# Patient Record
Sex: Female | Born: 1995 | Race: Black or African American | Hispanic: No | Marital: Single | Smoking: Former smoker
Health system: Southern US, Community
[De-identification: ages and names within clinical notes are randomized; demographics above are authoritative.]

## PROBLEM LIST (undated history)

## (undated) ENCOUNTER — Ambulatory Visit (HOSPITAL_COMMUNITY): Admission: EM | Payer: Medicaid Other | Source: Home / Self Care

## (undated) DIAGNOSIS — E669 Obesity, unspecified: Secondary | ICD-10-CM

## (undated) HISTORY — PX: OTHER SURGICAL HISTORY: SHX169

## (undated) HISTORY — PX: TONSILLECTOMY: SUR1361

## (undated) HISTORY — PX: TONSILECTOMY, ADENOIDECTOMY, BILATERAL MYRINGOTOMY AND TUBES: SHX2538

---
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
## 2017-05-08 ENCOUNTER — Ambulatory Visit: Payer: Self-pay | Admitting: Nurse Practitioner

## 2017-10-12 IMAGING — US US ABDOMEN COMPLETE
1 series · 14 of 25 positions shown · non-contrast
Comparison: None.

CLINICAL DATA: Abdominal pain for 2 weeks

EXAM:
ABDOMEN ULTRASOUND COMPLETE

[Series 1: us abdomen complete · 0.32mm/px · 14 of 75 slices shown]
[im 1/75]
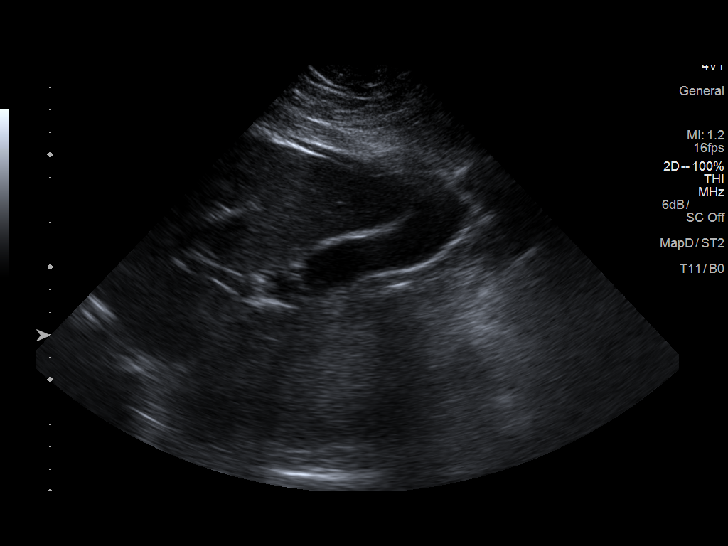
[im 7/75]
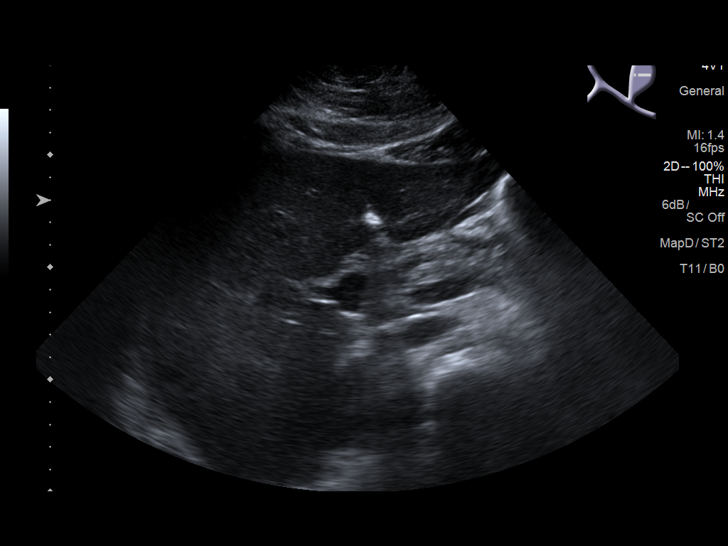
[im 13/75]
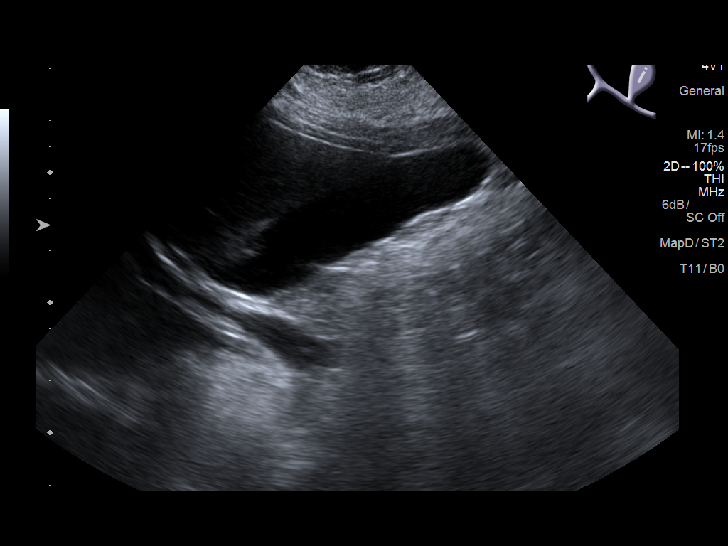
[im 19/75]
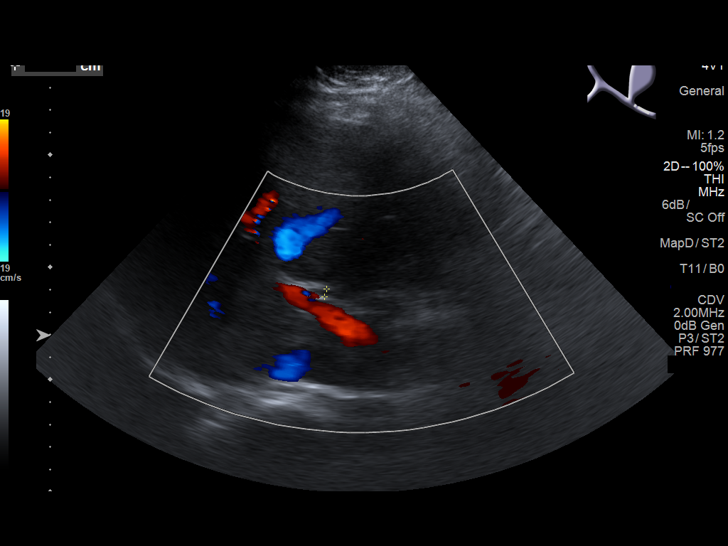
[im 25/75]
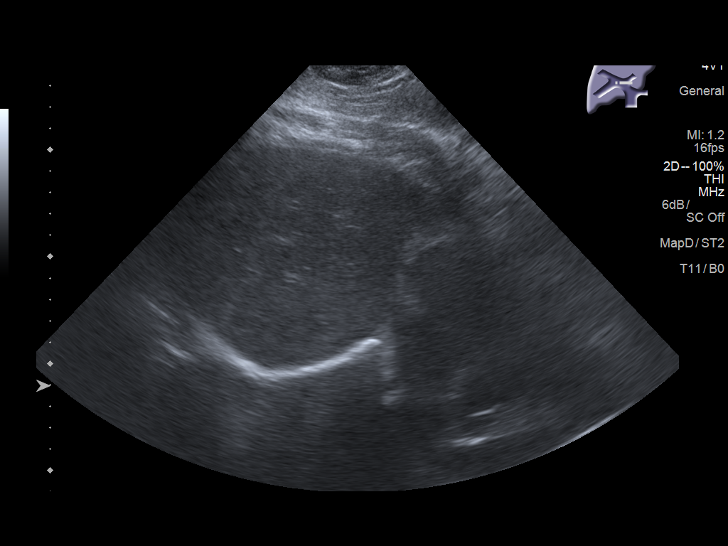
[im 28/75]
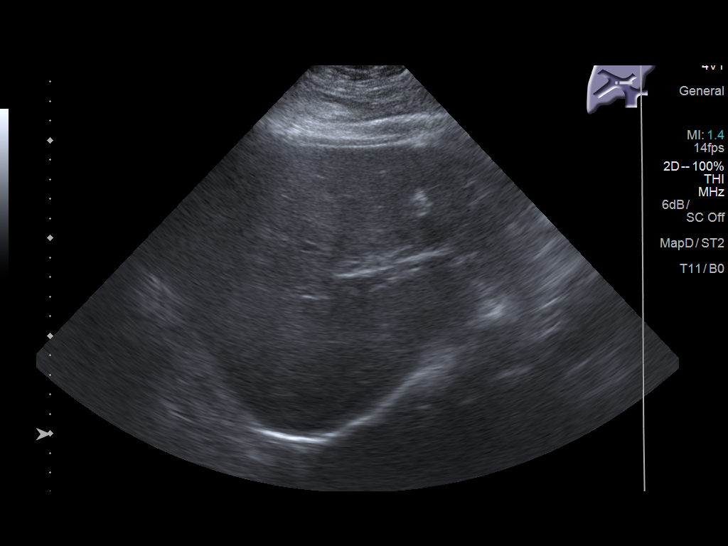
[im 34/75]
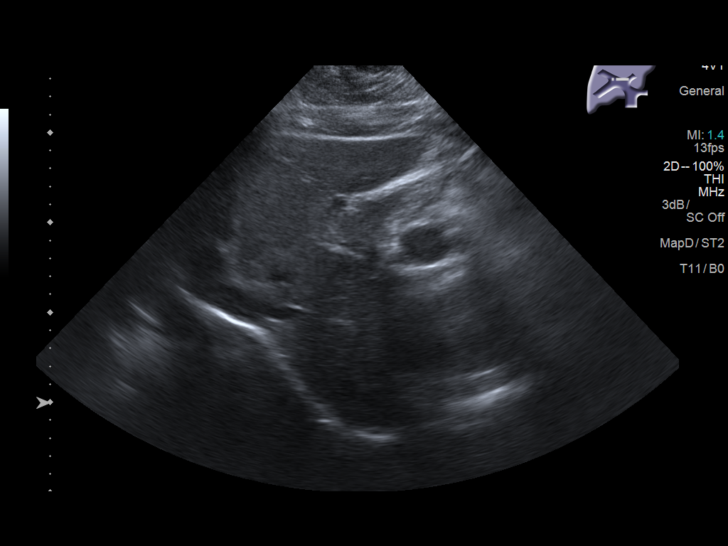
[im 41/75]
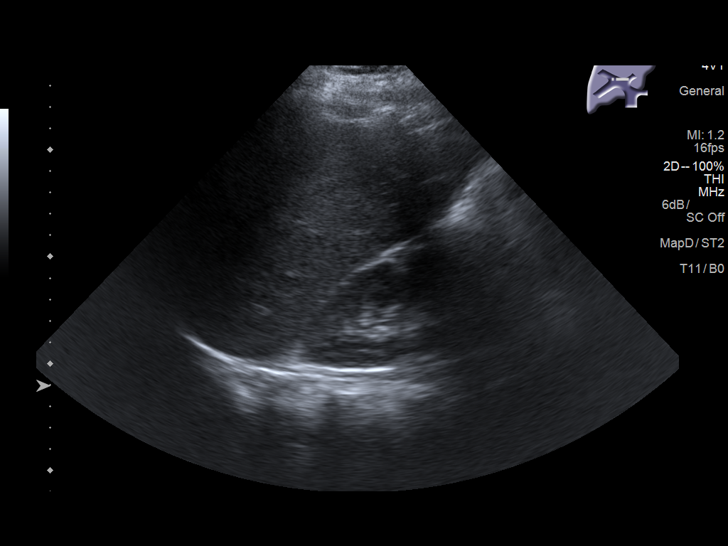
[im 47/75]
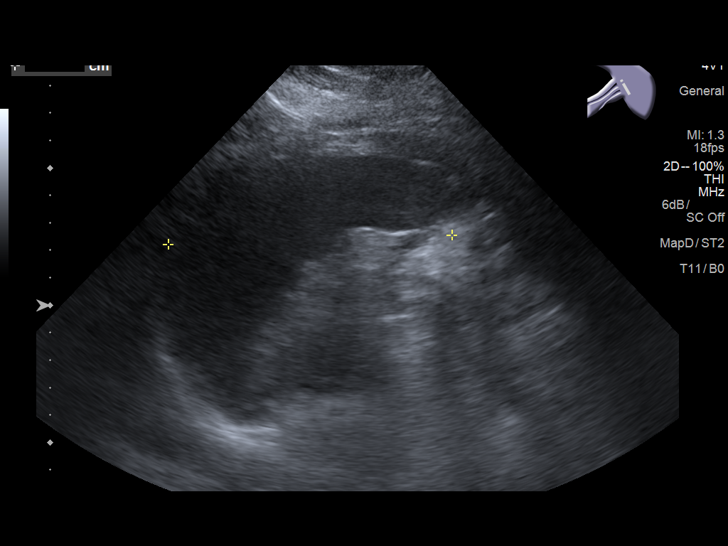
[im 50/75]
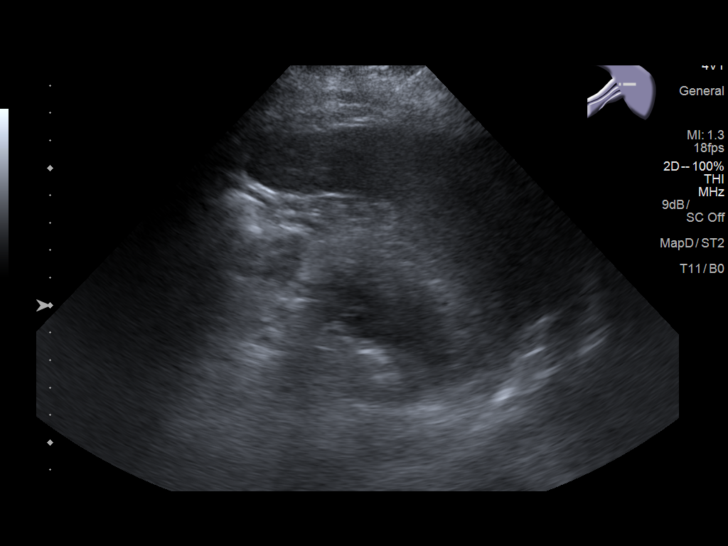
[im 56/75]
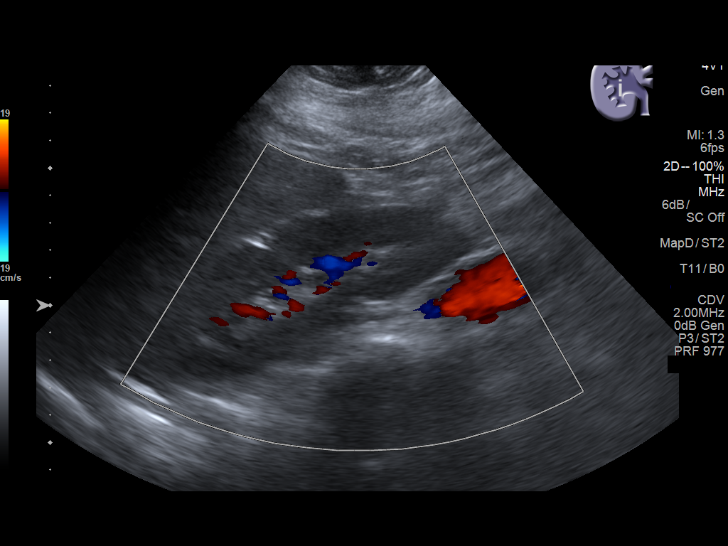
[im 62/75]
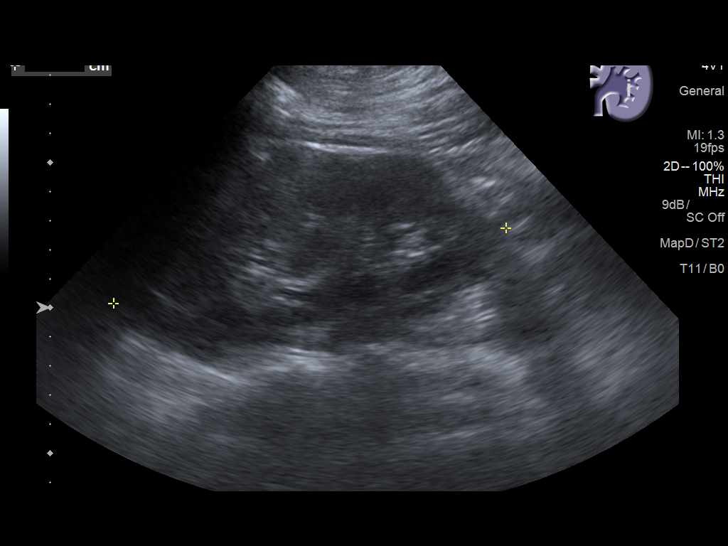
[im 68/75]
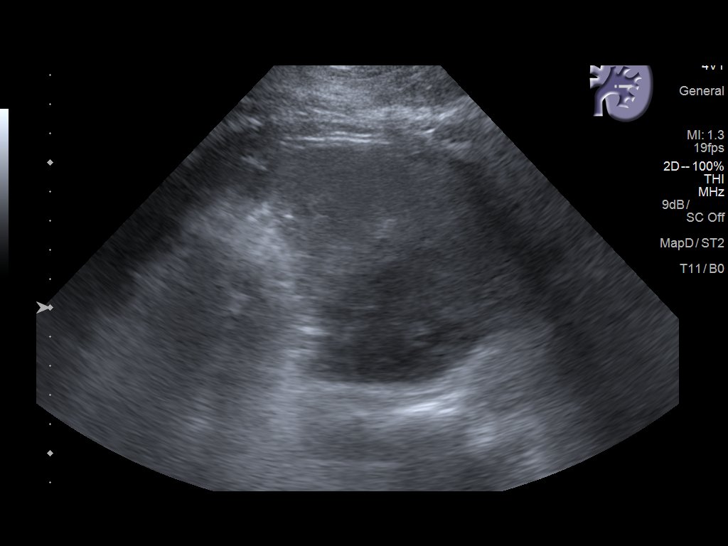
[im 75/75]
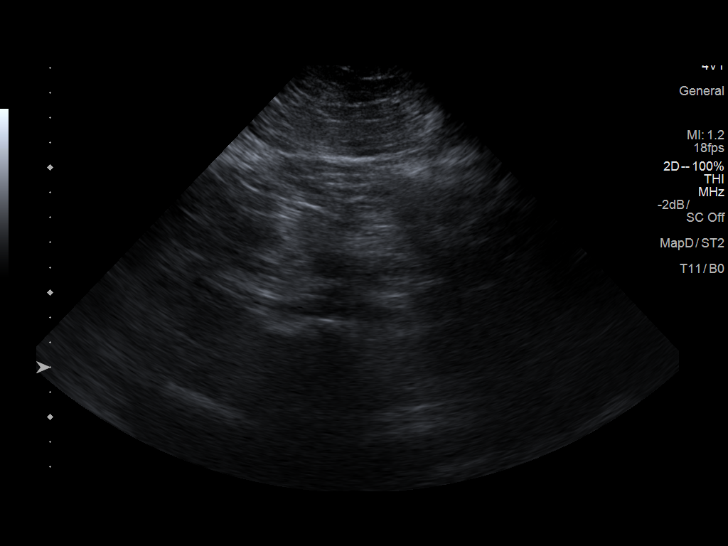

[14 of 25 positions shown; findings below may reference images not displayed]

FINDINGS: Gallbladder: No gallstones or wall thickening visualized. No
sonographic Murphy sign noted by sonographer.

Common bile duct: Diameter: 3.6 mm

Liver: Increased hepatic echogenicity consistent with fatty
infiltration. No focal hepatic abnormality is visualized.

IVC: No abnormality visualized.

Pancreas: Visualized portion unremarkable.

Spleen: Size and appearance within normal limits.

Right Kidney: Length: 11.9 cm. Echogenicity within normal limits. No
mass or hydronephrosis visualized.

Left Kidney: Length: 13.7 cm. Echogenicity within normal limits. No
mass or hydronephrosis visualized.

Abdominal aorta: No aneurysm visualized.

Other findings: None.
IMPRESSION: Mild fatty infiltration of the liver. Otherwise negative abdominal
ultrasound

## 2018-02-28 ENCOUNTER — Emergency Department (HOSPITAL_COMMUNITY): Payer: Self-pay

## 2018-02-28 ENCOUNTER — Emergency Department (HOSPITAL_COMMUNITY)
Admission: EM | Admit: 2018-02-28 | Discharge: 2018-03-01 | Disposition: A | Discharge status: Home / Self Care | Payer: Self-pay | Attending: Emergency Medicine | Admitting: Emergency Medicine

## 2018-02-28 ENCOUNTER — Encounter (HOSPITAL_COMMUNITY): Payer: Self-pay

## 2018-02-28 DIAGNOSIS — R600 Localized edema: Secondary | ICD-10-CM | POA: Insufficient documentation

## 2018-02-28 DIAGNOSIS — M25462 Effusion, left knee: Secondary | ICD-10-CM

## 2018-02-28 DIAGNOSIS — R451 Restlessness and agitation: Secondary | ICD-10-CM | POA: Insufficient documentation

## 2018-02-28 DIAGNOSIS — F17200 Nicotine dependence, unspecified, uncomplicated: Secondary | ICD-10-CM | POA: Insufficient documentation

## 2018-02-28 DIAGNOSIS — S022XXA Fracture of nasal bones, initial encounter for closed fracture: Secondary | ICD-10-CM

## 2018-02-28 DIAGNOSIS — M25562 Pain in left knee: Secondary | ICD-10-CM | POA: Insufficient documentation

## 2018-02-28 DIAGNOSIS — S060X1A Concussion with loss of consciousness of 30 minutes or less, initial encounter: Secondary | ICD-10-CM

## 2018-02-28 DIAGNOSIS — M542 Cervicalgia: Secondary | ICD-10-CM | POA: Insufficient documentation

## 2018-02-28 DIAGNOSIS — Y92810 Car as the place of occurrence of the external cause: Secondary | ICD-10-CM | POA: Insufficient documentation

## 2018-02-28 DIAGNOSIS — J45909 Unspecified asthma, uncomplicated: Secondary | ICD-10-CM | POA: Insufficient documentation

## 2018-02-28 DIAGNOSIS — Y999 Unspecified external cause status: Secondary | ICD-10-CM | POA: Insufficient documentation

## 2018-02-28 DIAGNOSIS — Z23 Encounter for immunization: Secondary | ICD-10-CM | POA: Insufficient documentation

## 2018-02-28 DIAGNOSIS — Y9389 Activity, other specified: Secondary | ICD-10-CM | POA: Insufficient documentation

## 2018-02-28 LAB — RAPID URINE DRUG SCREEN, HOSP PERFORMED
Amphetamines: NOT DETECTED
BARBITURATES: NOT DETECTED
BENZODIAZEPINES: NOT DETECTED
COCAINE: NOT DETECTED
Opiates: NOT DETECTED
TETRAHYDROCANNABINOL: POSITIVE — AB

## 2018-02-28 LAB — COMPREHENSIVE METABOLIC PANEL
ALT: 10 U/L (ref 0–44)
ANION GAP: 8 (ref 5–15)
AST: 16 U/L (ref 15–41)
Albumin: 3.5 g/dL (ref 3.5–5.0)
Alkaline Phosphatase: 51 U/L (ref 38–126)
BILIRUBIN TOTAL: 0.4 mg/dL (ref 0.3–1.2)
BUN: 7 mg/dL (ref 6–20)
CALCIUM: 8.8 mg/dL — AB (ref 8.9–10.3)
CO2: 26 mmol/L (ref 22–32)
Chloride: 103 mmol/L (ref 98–111)
Creatinine, Ser: 0.75 mg/dL (ref 0.44–1.00)
Glucose, Bld: 91 mg/dL (ref 70–99)
Potassium: 3.6 mmol/L (ref 3.5–5.1)
SODIUM: 137 mmol/L (ref 135–145)
TOTAL PROTEIN: 6.7 g/dL (ref 6.5–8.1)

## 2018-02-28 LAB — I-STAT CHEM 8, ED
BUN: 7 mg/dL (ref 6–20)
CALCIUM ION: 1.12 mmol/L — AB (ref 1.15–1.40)
CHLORIDE: 101 mmol/L (ref 98–111)
CREATININE: 0.7 mg/dL (ref 0.44–1.00)
GLUCOSE: 91 mg/dL (ref 70–99)
HCT: 38 % (ref 36.0–46.0)
Hemoglobin: 12.9 g/dL (ref 12.0–15.0)
Potassium: 3.6 mmol/L (ref 3.5–5.1)
Sodium: 140 mmol/L (ref 135–145)
TCO2: 27 mmol/L (ref 22–32)

## 2018-02-28 LAB — URINALYSIS, ROUTINE W REFLEX MICROSCOPIC
Bilirubin Urine: NEGATIVE
GLUCOSE, UA: NEGATIVE mg/dL
Ketones, ur: NEGATIVE mg/dL
LEUKOCYTES UA: NEGATIVE
NITRITE: NEGATIVE
PROTEIN: NEGATIVE mg/dL
SPECIFIC GRAVITY, URINE: 1.019 (ref 1.005–1.030)
pH: 5 (ref 5.0–8.0)

## 2018-02-28 LAB — I-STAT CG4 LACTIC ACID, ED: Lactic Acid, Venous: 0.85 mmol/L (ref 0.5–1.9)

## 2018-02-28 LAB — I-STAT BETA HCG BLOOD, ED (MC, WL, AP ONLY): I-stat hCG, quantitative: <5 m[IU]/mL (ref <5)

## 2018-02-28 LAB — CBC
HCT: 40.4 % (ref 36.0–46.0)
Hemoglobin: 12.4 g/dL (ref 12.0–15.0)
MCH: 25.8 pg — ABNORMAL LOW (ref 26.0–34.0)
MCHC: 30.7 g/dL (ref 30.0–36.0)
MCV: 84 fL (ref 80.0–100.0)
NRBC: 0 % (ref 0.0–0.2)
PLATELETS: 309 10*3/uL (ref 150–400)
RBC: 4.81 MIL/uL (ref 3.87–5.11)
RDW: 13.4 % (ref 11.5–15.5)
WBC: 14.3 10*3/uL — AB (ref 4.0–10.5)

## 2018-02-28 LAB — CDS SEROLOGY

## 2018-02-28 LAB — PROTIME-INR
INR: 1.02
Prothrombin Time: 13.3 seconds (ref 11.4–15.2)

## 2018-02-28 LAB — SAMPLE TO BLOOD BANK

## 2018-02-28 LAB — ETHANOL

## 2018-02-28 MED ORDER — TETANUS-DIPHTH-ACELL PERTUSSIS 5-2.5-18.5 LF-MCG/0.5 IM SUSP
0.5000 mL | Freq: Once | INTRAMUSCULAR | Status: AC
  Administered 2018-02-28: 0.5 mL via INTRAMUSCULAR
  Filled 2018-02-28: qty 0.5

## 2018-02-28 MED ORDER — LIDOCAINE HCL (PF) 1 % IJ SOLN
5.0000 mL | Freq: Once | INTRAMUSCULAR | Status: AC
  Administered 2018-02-28: 5 mL via INTRADERMAL
  Filled 2018-02-28: qty 5

## 2018-02-28 MED ORDER — FENTANYL CITRATE (PF) 100 MCG/2ML IJ SOLN
50.0000 ug | Freq: Once | INTRAMUSCULAR | Status: AC
  Administered 2018-02-28: 50 ug via INTRAVENOUS
  Filled 2018-02-28: qty 2

## 2018-02-28 NOTE — ED Notes (Signed)
Pt refuses to get on monitor
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
## 2018-03-01 ENCOUNTER — Encounter (HOSPITAL_COMMUNITY): Payer: Self-pay | Admitting: Emergency Medicine

## 2018-03-01 ENCOUNTER — Emergency Department (HOSPITAL_COMMUNITY): Payer: Self-pay

## 2018-03-01 MED ORDER — CEFAZOLIN SODIUM-DEXTROSE 1-4 GM/50ML-% IV SOLN
1.0000 g | Freq: Once | INTRAVENOUS | Status: AC
  Administered 2018-03-01: 1 g via INTRAVENOUS
  Filled 2018-03-01: qty 50

## 2018-03-01 MED ORDER — CEPHALEXIN 500 MG PO CAPS: 500.0000 mg | ORAL_CAPSULE | Freq: Two times a day (BID) | ORAL | 0 refills | Status: AC

## 2018-03-01 MED ORDER — HYDROCODONE-ACETAMINOPHEN 5-325 MG PO TABS: 1.0000 | ORAL_TABLET | Freq: Four times a day (QID) | ORAL | 0 refills | Status: DC | PRN
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
## 2018-03-12 ENCOUNTER — Inpatient Hospital Stay: Payer: Self-pay

## 2018-04-09 ENCOUNTER — Ambulatory Visit (HOSPITAL_COMMUNITY)
Admission: EM | Admit: 2018-04-09 | Discharge: 2018-04-09 | Disposition: A | Discharge status: Home / Self Care | Payer: Medicaid Other | Attending: Internal Medicine | Admitting: Internal Medicine

## 2018-04-09 ENCOUNTER — Encounter (HOSPITAL_COMMUNITY): Payer: Self-pay | Admitting: Emergency Medicine

## 2018-04-09 DIAGNOSIS — R030 Elevated blood-pressure reading, without diagnosis of hypertension: Secondary | ICD-10-CM | POA: Insufficient documentation

## 2018-04-09 DIAGNOSIS — J069 Acute upper respiratory infection, unspecified: Secondary | ICD-10-CM

## 2018-04-09 MED ORDER — OXYMETAZOLINE HCL 0.05 % NA SOLN: 2.0000 | Freq: Two times a day (BID) | NASAL | 0 refills | Status: AC

## 2018-04-09 MED ORDER — GUAIFENESIN-CODEINE 100-10 MG/5ML PO SOLN: 10.0000 mL | Freq: Four times a day (QID) | ORAL | 0 refills | Status: AC | PRN

## 2018-04-09 NOTE — ED Triage Notes (Signed)
Pt c/o URI symptoms, coughing, sneezing, congestion, headaches x2 days.

## 2018-04-09 NOTE — ED Provider Notes (Signed)
[facility]    CSN: [id number] Arrival date & time: 04/09/18  1749     History   Chief Complaint Chief Complaint  Patient presents with  . URI    HPI Briana Lara is a 22 y.o. female.   Who presents with with rhinitis, cough, stuffy nose and fever up to 99.9. Denies body aches, but has had HA on her forehead. Does not have a PCP. States she broke her nose in November and feels now her nose is burning from the congestion. She missed work today since she was not feeling well.     Past Medical History:  Diagnosis Date  . Asthma   . Obesity     There are no active problems to display for this patient.   Past Surgical History:  Procedure Laterality Date  . nasal abscess    . TONSILECTOMY, ADENOIDECTOMY, BILATERAL MYRINGOTOMY AND TUBES    . TONSILLECTOMY      OB History   No obstetric history on file.    Home Medications    Prior to Admission medications   Medication Sig Start Date End Date Taking? Authorizing Provider  ibuprofen (ADVIL,MOTRIN) 600 MG tablet Take 1 tablet (600 mg total) by mouth every 6 (six) hours as needed. 03/15/16   Kirichenko, Lemont Fillers, PA-C  sodium-potassium bicarbonate (ALKA-SELTZER GOLD) TBEF dissolvable tablet Take 1 tablet by mouth daily as needed.    [provider]    Family History No family history on file.  Social History Social History   Tobacco Use  . Smoking status: Current Every Day Smoker  Substance Use Topics  . Alcohol use: No  . Drug use: Yes    Types: Marijuana     Allergies   Mango flavor   Review of Systems Review of Systems  Constitutional: Positive for chills, fatigue and fever. Negative for diaphoresis.  HENT: Positive for congestion, postnasal drip and rhinorrhea. Negative for ear discharge, ear pain, facial swelling, mouth sores, sore throat, trouble swallowing and voice change.   Eyes: Negative for discharge.  Respiratory: Positive for cough. Negative for shortness of breath  and wheezing.   Cardiovascular: Negative for chest pain.  Skin: Negative for rash.  Neurological: Positive for light-headedness.       Off and on since sick  Hematological: Negative for adenopathy.     Physical Exam Triage Vital Signs ED Triage Vitals [04/09/18 1803]  Enc Vitals Group     BP (!) 153/96     Pulse Rate 77     Resp 16     Temp 99.2 F (37.3 C)     Temp src      SpO2 100 %     Weight      Height      Head Circumference      Peak Flow      Pain Score 8     Pain Loc      Pain Edu?      Excl. in GC?    No data found.  Updated Vital Signs BP (!) 153/96   Pulse 77   Temp 99.2 F (37.3 C)   Resp 16   LMP 04/02/2018   SpO2 100%   Visual Acuity Right Eye Distance:   Left Eye Distance:   Bilateral Distance:    Right Eye Near:   Left Eye Near:    Bilateral Near:     Physical Exam Vitals signs and nursing note reviewed.  Constitutional:  General: She is not in acute distress.    Appearance: She is obese. She is not toxic-appearing.     Comments: Was on her phone texting almost the entire visit, til I asked her to put it away.   HENT:     Head: Normocephalic.     Right Ear: Tympanic membrane, ear canal and external ear normal.     Left Ear: Tympanic membrane, ear canal and external ear normal.     Nose: Congestion and rhinorrhea present.     Comments: Mild septal deviation on the R noted. Has moderate congestion on the L than R, but still able to pass air. No blood noted. Has clear nose mucous.     Mouth/Throat:     Mouth: Mucous membranes are moist.     Pharynx: No oropharyngeal exudate or posterior oropharyngeal erythema.  Eyes:     General: No scleral icterus.       Right eye: No discharge.        Left eye: No discharge.     Extraocular Movements: Extraocular movements intact.     Conjunctiva/sclera: Conjunctivae normal.  Neck:     Musculoskeletal: Neck supple. No neck rigidity.  Cardiovascular:     Rate and Rhythm: Normal rate and  regular rhythm.     Heart sounds: No murmur.  Pulmonary:     Effort: Pulmonary effort is normal. No respiratory distress.     Breath sounds: No wheezing or rales.  Musculoskeletal: Normal range of motion.  Lymphadenopathy:     Cervical: No cervical adenopathy.  Skin:    General: Skin is warm and dry.  Neurological:     General: No focal deficit present.     Mental Status: She is alert and oriented to person, place, and time.  Psychiatric:        Mood and Affect: Mood normal.        Behavior: Behavior normal.        Thought Content: Thought content normal.        Judgment: Judgment normal.    UC Treatments / Results  Labs (all labs ordered are listed, but only abnormal results are displayed) Labs Reviewed - No data to display  EKG None  Radiology No results found.  Procedures Procedures   Medications Ordered in UC Medications - No data to display  Initial Impression / Assessment and Plan / UC Course  I have reviewed the triage vital signs and the nursing notes. I reassured her she just has a cold, no signs of infection., May use Robitussin and Afrin prn.  If she develops a fever 100.5 or more, then needs to be seen.  Advised to do BP diaries and get established with a PCP.    Final Clinical Impressions(s) / UC Diagnoses   Final diagnoses:  None   Discharge Instructions   None    ED Prescriptions    None     Controlled Substance Prescriptions [place] Controlled Substance Registry consulted?    Garey HamRodriguez-Southworth, Ascher Schroepfer, [place] JerseyPA-C 04/09/18 2024

## 2018-04-09 NOTE — Discharge Instructions (Addendum)
You can try to do saline nose rinses twice a day to help with the nose congestion.   You have a virus which your body will fight on its own and may take 7-14 days. If you get worse with fevers over 100.5 or more, then you need to be examined again.  You may use cough medication as needed, and nose spray which was sent to your pharmacy to help the stuffy nose.   Monitor your blood pressure at the pharmacy and write them down. Needs to stay less than 140/90. If stays elevated, then you need to make sure you get established with a primary care provider.

## 2019-05-07 ENCOUNTER — Other Ambulatory Visit: Payer: Self-pay

## 2019-05-07 ENCOUNTER — Emergency Department (HOSPITAL_COMMUNITY): Payer: Self-pay

## 2019-05-07 ENCOUNTER — Emergency Department (HOSPITAL_COMMUNITY)
Admission: EM | Admit: 2019-05-07 | Discharge: 2019-05-07 | Disposition: A | Discharge status: Home / Self Care | Payer: Self-pay | Attending: Emergency Medicine | Admitting: Emergency Medicine

## 2019-05-07 ENCOUNTER — Encounter (HOSPITAL_COMMUNITY): Payer: Self-pay | Admitting: Emergency Medicine

## 2019-05-07 DIAGNOSIS — K047 Periapical abscess without sinus: Secondary | ICD-10-CM | POA: Insufficient documentation

## 2019-05-07 DIAGNOSIS — J45909 Unspecified asthma, uncomplicated: Secondary | ICD-10-CM | POA: Insufficient documentation

## 2019-05-07 DIAGNOSIS — F17200 Nicotine dependence, unspecified, uncomplicated: Secondary | ICD-10-CM | POA: Insufficient documentation

## 2019-05-07 LAB — CBC WITH DIFFERENTIAL/PLATELET
Abs Immature Granulocytes: 0.04 10*3/uL (ref 0.00–0.07)
Basophils Absolute: 0.1 10*3/uL (ref 0.0–0.1)
Basophils Relative: 0 %
Eosinophils Absolute: 0.4 10*3/uL (ref 0.0–0.5)
Eosinophils Relative: 4 %
HCT: 38.7 % (ref 36.0–46.0)
Hemoglobin: 11.3 g/dL — ABNORMAL LOW (ref 12.0–15.0)
Immature Granulocytes: 0 %
Lymphocytes Relative: 31 %
Lymphs Abs: 3.5 10*3/uL (ref 0.7–4.0)
MCH: 23.9 pg — ABNORMAL LOW (ref 26.0–34.0)
MCHC: 29.2 g/dL — ABNORMAL LOW (ref 30.0–36.0)
MCV: 81.8 fL (ref 80.0–100.0)
Monocytes Absolute: 0.7 10*3/uL (ref 0.1–1.0)
Monocytes Relative: 6 %
Neutro Abs: 6.7 10*3/uL (ref 1.7–7.7)
Neutrophils Relative %: 59 %
Platelets: 311 10*3/uL (ref 150–400)
RBC: 4.73 MIL/uL (ref 3.87–5.11)
RDW: 14.8 % (ref 11.5–15.5)
WBC: 11.3 10*3/uL — ABNORMAL HIGH (ref 4.0–10.5)
nRBC: 0 % (ref 0.0–0.2)

## 2019-05-07 LAB — BASIC METABOLIC PANEL
Anion gap: 9 (ref 5–15)
BUN: <5 mg/dL — ABNORMAL LOW (ref 6–20)
CO2: 27 mmol/L (ref 22–32)
Calcium: 8.7 mg/dL — ABNORMAL LOW (ref 8.9–10.3)
Chloride: 102 mmol/L (ref 98–111)
Creatinine, Ser: 0.68 mg/dL (ref 0.44–1.00)
GFR calc Af Amer: >60 mL/min (ref >60)
GFR calc non Af Amer: >60 mL/min (ref >60)
Glucose, Bld: 92 mg/dL (ref 70–99)
Potassium: 4.5 mmol/L (ref 3.5–5.1)
Sodium: 138 mmol/L (ref 135–145)

## 2019-05-07 LAB — I-STAT BETA HCG BLOOD, ED (MC, WL, AP ONLY): I-stat hCG, quantitative: <5 m[IU]/mL (ref <5)

## 2019-05-07 MED ORDER — MORPHINE SULFATE (PF) 4 MG/ML IV SOLN
4.0000 mg | Freq: Once | INTRAVENOUS | Status: AC
  Administered 2019-05-07: 16:00:00 4 mg via INTRAVENOUS
  Filled 2019-05-07: qty 1

## 2019-05-07 MED ORDER — HYDROCODONE-ACETAMINOPHEN 5-325 MG PO TABS: 2.0000 | ORAL_TABLET | Freq: Four times a day (QID) | ORAL | 0 refills | Status: DC | PRN

## 2019-05-07 MED ORDER — CLINDAMYCIN HCL 300 MG PO CAPS: 300.0000 mg | ORAL_CAPSULE | Freq: Three times a day (TID) | ORAL | 0 refills | Status: AC

## 2019-05-07 MED ORDER — CLINDAMYCIN PHOSPHATE 900 MG/50ML IV SOLN
900.0000 mg | Freq: Once | INTRAVENOUS | Status: AC
  Administered 2019-05-07: 12:00:00 900 mg via INTRAVENOUS
  Filled 2019-05-07: qty 50

## 2019-05-07 MED ORDER — IOHEXOL 300 MG/ML  SOLN
75.0000 mL | Freq: Once | INTRAMUSCULAR | Status: AC | PRN
  Administered 2019-05-07: 75 mL via INTRAVENOUS

## 2019-05-07 MED ORDER — MORPHINE SULFATE (PF) 4 MG/ML IV SOLN
4.0000 mg | Freq: Once | INTRAVENOUS | Status: AC
  Administered 2019-05-07: 13:00:00 4 mg via INTRAVENOUS
  Filled 2019-05-07: qty 1

## 2019-05-07 NOTE — ED Triage Notes (Signed)
Pt reports Upper L dental pain that began 1 week ago, reports swelling to face began on Sunday. Pt has swelling in cheek and up around her eye. Hx of dental abscess.

## 2019-05-07 NOTE — Discharge Instructions (Addendum)
Follow-up at [facility] pediatric dentistry at 11:45 AM for appointment with Dr. Lynnda Shields  Address: [address], [place], [place] [address] Hours:  Open ? Closes 5PM Phone: [phone]

## 2019-05-07 NOTE — ED Provider Notes (Signed)
[facility] EMERGENCY DEPARTMENT Provider Note   CSN: [id number] Arrival date & time: 05/07/19  1019     History Chief Complaint  Patient presents with  . Facial Swelling    Briana Lara is a 24 y.o. female.  The history is provided by the patient.  Illness Location:  Left face Quality:  Swelling Severity:  Moderate Onset quality:  Gradual Duration:  3 days Timing:  Constant Progression:  Worsening Chronicity:  New Context:  Worsening left sided facial swelling Relieved by:  Nothing Worsened by:  Nothing Associated symptoms: no abdominal pain, no chest pain, no congestion, no cough, no diarrhea, no ear pain, no fever, no loss of consciousness, no rash, no shortness of breath, no sore throat and no vomiting        Past Medical History:  Diagnosis Date  . Asthma   . Obesity     There are no problems to display for this patient.   Past Surgical History:  Procedure Laterality Date  . nasal abscess    . TONSILECTOMY, ADENOIDECTOMY, BILATERAL MYRINGOTOMY AND TUBES    . TONSILLECTOMY       OB History   No obstetric history on file.     No family history on file.  Social History   Tobacco Use  . Smoking status: Current Every Day Smoker  Substance Use Topics  . Alcohol use: No  . Drug use: Yes    Types: Marijuana    Home Medications Prior to Admission medications   Medication Sig Start Date End Date Taking? Authorizing Provider  clindamycin (CLEOCIN) 300 MG capsule Take 1 capsule (300 mg total) by mouth 3 (three) times daily for 10 days. 05/07/19 05/17/19  Jahnai Slingerland, DO  HYDROcodone-acetaminophen (NORCO/VICODIN) 5-325 MG tablet Take 2 tablets by mouth every 6 (six) hours as needed for up to 12 doses. 05/07/19   Virgina Norfolk, DO    Allergies    Mango flavor  Review of Systems   Review of Systems  Constitutional: Negative for chills and fever.  HENT: Positive for dental problem, facial swelling and sinus pressure.  Negative for congestion, ear pain and sore throat.   Eyes: Negative for pain and visual disturbance.  Respiratory: Negative for cough and shortness of breath.   Cardiovascular: Negative for chest pain and palpitations.  Gastrointestinal: Negative for abdominal pain, diarrhea and vomiting.  Genitourinary: Negative for dysuria and hematuria.  Musculoskeletal: Negative for arthralgias and back pain.  Skin: Negative for color change and rash.  Neurological: Negative for seizures, loss of consciousness and syncope.  All other systems reviewed and are negative.   Physical Exam Updated Vital Signs  ED Triage Vitals  Enc Vitals Group     BP 05/07/19 1034 (!) 127/100     Pulse Rate 05/07/19 1034 73     Resp 05/07/19 1034 18     Temp 05/07/19 1034 98.4 F (36.9 C)     Temp src --      SpO2 05/07/19 1034 100 %     Weight --      Height --      Head Circumference --      Peak Flow --      Pain Score 05/07/19 1032 10     Pain Loc --      Pain Edu? --      Excl. in GC? --     Physical Exam Vitals and nursing note reviewed.  Constitutional:      General: She  is not in acute distress.    Appearance: She is well-developed. She is not ill-appearing.  HENT:     Head: Normocephalic and atraumatic.     Comments: Left-sided facial swelling, poor dentition to the upper teeth but no obvious abscess or fullness seen in the upper teeth    Nose: Nose normal.     Mouth/Throat:     Mouth: Mucous membranes are moist.  Eyes:     Extraocular Movements: Extraocular movements intact.     Conjunctiva/sclera: Conjunctivae normal.     Pupils: Pupils are equal, round, and reactive to light.     Comments: Normal extraocular movement, no swelling around the eyes  Cardiovascular:     Rate and Rhythm: Normal rate and regular rhythm.     Pulses: Normal pulses.     Heart sounds: Normal heart sounds. No murmur.  Pulmonary:     Effort: Pulmonary effort is normal. No respiratory distress.     Breath sounds:  Normal breath sounds.  Abdominal:     General: Abdomen is flat.     Palpations: Abdomen is soft.     Tenderness: There is no abdominal tenderness.  Musculoskeletal:     Cervical back: Normal range of motion and neck supple.  Skin:    General: Skin is warm and dry.  Neurological:     General: No focal deficit present.     Mental Status: She is alert.  Psychiatric:        Mood and Affect: Mood normal.     ED Results / Procedures / Treatments   Labs (all labs ordered are listed, but only abnormal results are displayed) Labs Reviewed  CBC WITH DIFFERENTIAL/PLATELET - Abnormal; Notable for the following components:      Result Value   WBC 11.3 (*)    Hemoglobin 11.3 (*)    MCH 23.9 (*)    MCHC 29.2 (*)    All other components within normal limits  BASIC METABOLIC PANEL - Abnormal; Notable for the following components:   BUN <5 (*)    Calcium 8.7 (*)    All other components within normal limits  I-STAT BETA HCG BLOOD, ED ([facility], [facility], [facility] ONLY)    EKG None  Radiology CT Maxillofacial W Contrast  Result Date: 05/07/2019 CLINICAL DATA:  Left upper dental pain with facial swelling. EXAM: CT MAXILLOFACIAL WITH CONTRAST TECHNIQUE: Multidetector CT imaging of the maxillofacial structures was performed with intravenous contrast. Multiplanar CT image reconstructions were also generated. CONTRAST:  2mL OMNIPAQUE IOHEXOL 300 MG/ML  SOLN COMPARISON:  CT sinus 09/15/2005 FINDINGS: Osseous: Multiple dental caries. Periapical apical abscess left upper first premolar with lateral bony destruction and adjacent subperiosteal abscess. No fracture identified. Orbits: No orbital mass or edema. Sinuses: Mild mucosal edema paranasal sinuses without air-fluid level. Soft tissues: Mild soft tissue edema left lateral face. Subperiosteal abscess adjacent to the left maxilla. Abscess measures approximately 8 x 15 mm. This is contiguous with periapical dental abscess and lateral bony destruction. Mild reactive  lymph nodes in the neck bilaterally. Limited intracranial: Negative IMPRESSION: Periapical abscess around left upper first molar with lateral cortical destruction and adjacent 8 x 15 mm subperiosteal abscess. Mild soft tissue swelling in the left lateral face compatible with cellulitis. Multiple caries. Electronically Signed   By: Franchot Gallo M.D.   On: 05/07/2019 14:35    Procedures Procedures (including critical care time)  Medications Ordered in ED Medications  clindamycin (CLEOCIN) IVPB 900 mg (0 mg Intravenous Stopped 05/07/19 1249)  morphine 4 MG/ML injection 4 mg (4 mg Intravenous Given 05/07/19 1320)  iohexol (OMNIPAQUE) 300 MG/ML solution 75 mL (75 mLs Intravenous Contrast Given 05/07/19 1341)    ED Course  I have reviewed the triage vital signs and the nursing notes.  Pertinent labs & imaging results that were available during my care of the patient were reviewed by me and considered in my medical decision making (see chart for details).    MDM Rules/Calculators/A&P  GUISELLE MIAN is 24 year old female with no significant medical history presents to the ED with left-sided facial swelling.  It appears that she likely has swelling from dental abscess.  She has poor upper tooth dentition.  CT scan of the face confirmed a periapical abscess of the left upper molar.  No signs to suggest sepsis.  Lab work was overall unremarkable.  No significant leukocytosis, anemia, electrolyte abnormality.  Talked with Dr. Lynnda Shields with [facility] pediatric [facility] and they will see the patient tomorrow morning for further care.  Patient was given IV clindamycin while in the ED.  Will prescribe clindamycin and will send narcotic pain medicine to her pharmacy.  She has no active narcotic scripts.  She is given return precautions and discharged in the ED in good condition.  There is no involvement of any other soft tissue structures or of her eyes.  This chart was dictated using voice recognition  software.  Despite best efforts to proofread,  errors can occur which can change the documentation meaning.    Final Clinical Impression(s) / ED Diagnoses Final diagnoses:  Dental abscess    Rx / DC Orders ED Discharge Orders         Ordered    clindamycin (CLEOCIN) 300 MG capsule  3 times daily     05/07/19 1528    HYDROcodone-acetaminophen (NORCO/VICODIN) 5-325 MG tablet  Every 6 hours PRN     05/07/19 1528           Virgina Norfolk, DO 05/07/19 1530

## 2019-05-07 NOTE — ED Notes (Signed)
Left side of face swollen from jaw up to eye. Ice applied for swelling and comfort.

## 2019-05-07 NOTE — ED Notes (Signed)
Pt states her girlfriend will be picking her up

## 2019-05-07 NOTE — ED Notes (Signed)
Pt transported to CT ?

## 2019-05-07 NOTE — ED Notes (Signed)
Patient verbalizes understanding of discharge instructions. Opportunity for questioning and answers were provided. Armband removed by staff, pt discharged from ED ambulatory. Pt's girlfriend will be picking up pt.

## 2020-05-07 ENCOUNTER — Ambulatory Visit (HOSPITAL_COMMUNITY)
Admission: EM | Admit: 2020-05-07 | Discharge: 2020-05-07 | Discharge status: Against Medical Advice | Payer: Medicaid Other

## 2020-05-07 ENCOUNTER — Other Ambulatory Visit: Payer: Self-pay

## 2020-05-08 ENCOUNTER — Encounter (HOSPITAL_COMMUNITY): Payer: Self-pay | Admitting: Emergency Medicine

## 2020-05-08 ENCOUNTER — Emergency Department (HOSPITAL_COMMUNITY)
Admission: EM | Admit: 2020-05-08 | Discharge: 2020-05-08 | Disposition: A | Discharge status: Home / Self Care | Payer: Medicaid Other | Attending: Emergency Medicine | Admitting: Emergency Medicine

## 2020-05-08 DIAGNOSIS — R5383 Other fatigue: Secondary | ICD-10-CM | POA: Insufficient documentation

## 2020-05-08 DIAGNOSIS — R030 Elevated blood-pressure reading, without diagnosis of hypertension: Secondary | ICD-10-CM | POA: Insufficient documentation

## 2020-05-08 DIAGNOSIS — N939 Abnormal uterine and vaginal bleeding, unspecified: Secondary | ICD-10-CM | POA: Insufficient documentation

## 2020-05-08 DIAGNOSIS — J45909 Unspecified asthma, uncomplicated: Secondary | ICD-10-CM | POA: Insufficient documentation

## 2020-05-08 DIAGNOSIS — F17200 Nicotine dependence, unspecified, uncomplicated: Secondary | ICD-10-CM | POA: Insufficient documentation

## 2020-05-08 LAB — CBC
HCT: 40.5 % (ref 36.0–46.0)
Hemoglobin: 12.3 g/dL (ref 12.0–15.0)
MCH: 25.3 pg — ABNORMAL LOW (ref 26.0–34.0)
MCHC: 30.4 g/dL (ref 30.0–36.0)
MCV: 83.2 fL (ref 80.0–100.0)
Platelets: 304 10*3/uL (ref 150–400)
RBC: 4.87 MIL/uL (ref 3.87–5.11)
RDW: 14.4 % (ref 11.5–15.5)
WBC: 10 10*3/uL (ref 4.0–10.5)
nRBC: 0 % (ref 0.0–0.2)

## 2020-05-08 LAB — LIPASE, BLOOD: Lipase: 41 U/L (ref 11–51)

## 2020-05-08 LAB — COMPREHENSIVE METABOLIC PANEL
ALT: 15 U/L (ref 0–44)
AST: 19 U/L (ref 15–41)
Albumin: 3.9 g/dL (ref 3.5–5.0)
Alkaline Phosphatase: 53 U/L (ref 38–126)
Anion gap: 8 (ref 5–15)
BUN: 14 mg/dL (ref 6–20)
CO2: 28 mmol/L (ref 22–32)
Calcium: 9 mg/dL (ref 8.9–10.3)
Chloride: 101 mmol/L (ref 98–111)
Creatinine, Ser: 0.74 mg/dL (ref 0.44–1.00)
GFR, Estimated: >60 mL/min (ref >60)
Glucose, Bld: 87 mg/dL (ref 70–99)
Potassium: 4.4 mmol/L (ref 3.5–5.1)
Sodium: 137 mmol/L (ref 135–145)
Total Bilirubin: 0.6 mg/dL (ref 0.3–1.2)
Total Protein: 7.2 g/dL (ref 6.5–8.1)

## 2020-05-08 LAB — I-STAT BETA HCG BLOOD, ED (MC, WL, AP ONLY): I-stat hCG, quantitative: <5 m[IU]/mL (ref <5)

## 2020-05-08 NOTE — Discharge Instructions (Addendum)
Your lab work today is reassuring, you are not anemic (low on blood). You should take a multivitamin with iron while having heavy bleeding.  Recommend follow up with GYN, see list below, walk in clinic hours are available.   Return to the ER for worsening or concerning symptoms.   There are many options for quick evaluation and management of certain GYN issues that do not require a long wait time or big bill from the emergency department.   Consider these options for your care in the future:   Walk-ins for certain complaints available at:    Center for [facility] at [facility]  [address]  [phone]   Center for [facility]  [address]  [phone]   You can make an appointment to see a GYN provider:   [facility] for [facility] at [facility]  [address]  [phone]   Center for [facility] at [facility]  [address] [employer]  [phone]   Center for [facility] at [facility]  [phone]   Center for [facility] at [facility]  [address]  [phone]   If you already have an established OB/GYN provider in the area you can make an appointment with them as well.

## 2020-05-08 NOTE — ED Provider Notes (Addendum)
[facility]-EMERGENCY DEPT Provider Note   CSN: [id number] Arrival date & time: 05/08/20  1206     History Chief Complaint  Patient presents with  . Vaginal Bleeding    Briana Lara is a 25 y.o. female.  25 year old female with history of asthma presents with complaint of heavy vaginal bleeding x11 days with clots.  Patient states that she started to feel fatigued today which prompted her to come to the emergency room.  No history of prior heavy or abnormal bleeding, no history of prior blood transfusions.  No other complaints or concerns today.        Past Medical History:  Diagnosis Date  . Asthma   . Obesity     There are no problems to display for this patient.   Past Surgical History:  Procedure Laterality Date  . nasal abscess    . TONSILECTOMY, ADENOIDECTOMY, BILATERAL MYRINGOTOMY AND TUBES    . TONSILLECTOMY       OB History   No obstetric history on file.     No family history on file.  Social History   Tobacco Use  . Smoking status: Current Every Day Smoker  Substance Use Topics  . Alcohol use: No  . Drug use: Yes    Types: Marijuana    Home Medications Prior to Admission medications   Medication Sig Start Date End Date Taking? Authorizing Provider  HYDROcodone-acetaminophen (NORCO/VICODIN) 5-325 MG tablet Take 2 tablets by mouth every 6 (six) hours as needed for up to 12 doses. 05/07/19   Virgina Norfolk, DO    Allergies    Mango flavor  Review of Systems   Review of Systems  Constitutional: Positive for fatigue.  Respiratory: Negative for shortness of breath.   Cardiovascular: Negative for chest pain.  Gastrointestinal: Negative for nausea and vomiting.  Genitourinary: Positive for pelvic pain and vaginal bleeding.  Musculoskeletal: Negative for myalgias.  Allergic/Immunologic: Negative for immunocompromised state.  Neurological: Positive for weakness.  Hematological: Does not bruise/bleed easily.   Psychiatric/Behavioral: Negative for confusion.  All other systems reviewed and are negative.   Physical Exam Updated Vital Signs BP (!) 144/100 (BP Location: Right Arm)   Pulse 73   Temp 98.8 F (37.1 C) (Oral)   Resp 18   LMP 04/27/2020   SpO2 100%   Physical Exam Vitals and nursing note reviewed.  Constitutional:      General: She is not in acute distress.    Appearance: She is well-developed and well-nourished. She is obese. She is not diaphoretic.  HENT:     Head: Normocephalic and atraumatic.  Cardiovascular:     Rate and Rhythm: Normal rate and regular rhythm.     Pulses: Normal pulses.     Heart sounds: Normal heart sounds.  Pulmonary:     Effort: Pulmonary effort is normal.     Breath sounds: Normal breath sounds.  Abdominal:     Palpations: Abdomen is soft.     Tenderness: There is no abdominal tenderness.  Skin:    General: Skin is warm and dry.     Findings: No erythema or rash.  Neurological:     Mental Status: She is alert and oriented to person, place, and time.     Gait: Gait normal.  Psychiatric:        Mood and Affect: Mood and affect normal.        Behavior: Behavior normal.     ED Results / Procedures / Treatments   Labs (  all labs ordered are listed, but only abnormal results are displayed) Labs Reviewed  CBC - Abnormal; Notable for the following components:      Result Value   MCH 25.3 (*)    All other components within normal limits  LIPASE, BLOOD  COMPREHENSIVE METABOLIC PANEL  URINALYSIS, ROUTINE W REFLEX MICROSCOPIC  I-STAT BETA HCG BLOOD, ED ([facility], [facility], [facility] ONLY)    EKG None  Radiology No results found.  Procedures Procedures (including critical care time)  Medications Ordered in ED Medications - No data to display  ED Course  I have reviewed the triage vital signs and the nursing notes.  Pertinent labs & imaging results that were available during my care of the patient were reviewed by me and considered in my medical  decision making (see chart for details).  Clinical Course as of 05/08/20 2012  Sat May 08, 2020  [id number] 25 year old female presents with complaint of 11 days of heavy vaginal bleeding, now feeling fatigued and weak.  On exam, patient is alert, oriented, well-appearing.  Abdomen is soft and nontender.  Patient's blood pressure has been elevated throughout otherwise her heart rate has been within normal limits, she is afebrile. Review of labs, CBC with normal hemoglobin hematocrit, lipase within normal limits, CMP unremarkable, hCG negative. Discussed results with patient, fortunately at this time, her vital signs are stable and she is not anemic requiring emergent transfusion or intervention at this time.  Patient will be referred to gynecology clinic for further work-up of her abnormal vaginal bleeding.  Advised return to ED for any worsening or concerning symptoms.  Was advised to take a multivitamin with iron while having her abnormal bleeding. [LM]    Clinical Course User Index [LM] Alden Hipp   MDM Rules/Calculators/A&P                          Final Clinical Impression(s) / ED Diagnoses Final diagnoses:  Vaginal bleeding  Elevated blood pressure reading    Rx / DC Orders ED Discharge Orders    None       Jeannie Fend, PA-C 05/08/20 1941    Jeannie Fend, PA-C 05/08/20 2012    Terrilee Files, MD 05/09/20 1003

## 2020-05-08 NOTE — ED Notes (Signed)
Patient refused vital signs and discharge instructions. Seen leaving the emergency department in no distress.

## 2020-05-08 NOTE — ED Triage Notes (Signed)
Patient c/o heavy vaginal bleeding with clots x11 days. States usual menstrual cycle is 2 -3 days. Also reports abdominal pain and fatigue. States changing pad 2x/hour.

## 2020-05-31 ENCOUNTER — Encounter: Payer: Self-pay | Admitting: Nurse Practitioner

## 2020-05-31 DIAGNOSIS — J45909 Unspecified asthma, uncomplicated: Secondary | ICD-10-CM | POA: Insufficient documentation

## 2020-05-31 DIAGNOSIS — I10 Essential (primary) hypertension: Secondary | ICD-10-CM

## 2020-05-31 DIAGNOSIS — L83 Acanthosis nigricans: Secondary | ICD-10-CM | POA: Insufficient documentation

## 2020-05-31 DIAGNOSIS — K219 Gastro-esophageal reflux disease without esophagitis: Secondary | ICD-10-CM | POA: Insufficient documentation

## 2020-05-31 DIAGNOSIS — F988 Other specified behavioral and emotional disorders with onset usually occurring in childhood and adolescence: Secondary | ICD-10-CM | POA: Insufficient documentation

## 2020-05-31 DIAGNOSIS — Z111 Encounter for screening for respiratory tuberculosis: Secondary | ICD-10-CM | POA: Insufficient documentation

## 2020-05-31 HISTORY — DX: Essential (primary) hypertension: I10

## 2020-06-01 ENCOUNTER — Other Ambulatory Visit: Payer: Self-pay

## 2020-06-01 ENCOUNTER — Other Ambulatory Visit (HOSPITAL_COMMUNITY)
Admission: RE | Admit: 2020-06-01 | Discharge: 2020-06-01 | Disposition: A | Discharge status: Home / Self Care | Payer: Medicaid Other | Source: Ambulatory Visit | Attending: Nurse Practitioner | Admitting: Nurse Practitioner

## 2020-06-01 ENCOUNTER — Ambulatory Visit (INDEPENDENT_AMBULATORY_CARE_PROVIDER_SITE_OTHER): Payer: Medicaid Other | Admitting: Nurse Practitioner

## 2020-06-01 ENCOUNTER — Encounter: Payer: Self-pay | Admitting: Nurse Practitioner

## 2020-06-01 VITALS — BP 145/73 | HR 81 | Ht 64.0 in | Wt 388.3 lb

## 2020-06-01 DIAGNOSIS — Z01419 Encounter for gynecological examination (general) (routine) without abnormal findings: Secondary | ICD-10-CM | POA: Insufficient documentation

## 2020-06-01 DIAGNOSIS — Z6841 Body Mass Index (BMI) 40.0 and over, adult: Secondary | ICD-10-CM

## 2020-06-01 DIAGNOSIS — Z113 Encounter for screening for infections with a predominantly sexual mode of transmission: Secondary | ICD-10-CM

## 2020-06-01 DIAGNOSIS — Z8742 Personal history of other diseases of the female genital tract: Secondary | ICD-10-CM

## 2020-06-01 DIAGNOSIS — Z30011 Encounter for initial prescription of contraceptive pills: Secondary | ICD-10-CM

## 2020-06-01 DIAGNOSIS — R030 Elevated blood-pressure reading, without diagnosis of hypertension: Secondary | ICD-10-CM

## 2020-06-01 DIAGNOSIS — N939 Abnormal uterine and vaginal bleeding, unspecified: Secondary | ICD-10-CM

## 2020-06-01 MED ORDER — NORETHINDRONE 0.35 MG PO TABS: 1.0000 | ORAL_TABLET | Freq: Every day | ORAL | 2 refills | Status: DC

## 2020-06-01 NOTE — Patient Instructions (Addendum)
Follow up with blood pressure at [employer] or [facility]  Levonorgestrel intrauterine device (IUD) What is this medicine? LEVONORGESTREL IUD (LEE voe nor jes trel) is a contraceptive (birth control) device. The device is placed inside the uterus by a health care provider. It is used to prevent pregnancy. Some devices can also be used to treat heavy bleeding that occurs during your period. This medicine may be used for other purposes; ask your health care provider or pharmacist if you have questions. COMMON BRAND NAME(S): Cameron Ali What should I tell my health care provider before I take this medicine? They need to know if you have any of these conditions:  abnormal Pap smear  cancer of the breast, uterus, or cervix  diabetes  endometritis  genital or pelvic infection now or in the past  have more than one sexual partner or your partner has more than one partner  heart disease  history of an ectopic or tubal pregnancy  immune system problems  IUD in place  liver disease or tumor  problems with blood clots or take blood-thinners  seizures  use intravenous drugs  uterus of unusual shape  vaginal bleeding that has not been explained  an unusual or allergic reaction to levonorgestrel, other hormones, silicone, or polyethylene, medicines, foods, dyes, or preservatives  pregnant or trying to get pregnant  breast-feeding How should I use this medicine? This device is placed inside the uterus by a health care professional. Talk to your pediatrician regarding the use of this medicine in children. Special care may be needed. Overdosage: If you think you have taken too much of this medicine contact a poison control center or emergency room at once. NOTE: This medicine is only for you. Do not share this medicine with others. What if I miss a dose? This does not apply. Depending on the brand of  device you have inserted, the device will need to be replaced every 3 to 7 years if you wish to continue using this type of birth control. What may interact with this medicine? Do not take this medicine with any of the following medications:  amprenavir  bosentan  fosamprenavir This medicine may also interact with the following medications:  aprepitant  armodafinil  barbiturate medicines for inducing sleep or treating seizures  bexarotene  boceprevir  griseofulvin  medicines to treat seizures like carbamazepine, ethotoin, felbamate, oxcarbazepine, phenytoin, topiramate  modafinil  pioglitazone  rifabutin  rifampin  rifapentine  some medicines to treat HIV infection like atazanavir, efavirenz, indinavir, lopinavir, nelfinavir, tipranavir, ritonavir  St. John's wort  warfarin This list may not describe all possible interactions. Give your health care provider a list of all the medicines, herbs, non-prescription drugs, or dietary supplements you use. Also tell them if you smoke, drink alcohol, or use illegal drugs. Some items may interact with your medicine. What should I watch for while using this medicine? Visit your doctor or health care professional for regular check ups. See your doctor if you or your partner has sexual contact with others, becomes HIV positive, or gets a sexual transmitted disease. This product does not protect you against HIV infection (AIDS) or other sexually transmitted diseases. You can check the placement of the IUD yourself by reaching up to the top of your vagina with clean fingers to feel the threads. Do not pull on the threads. It is a good habit to check placement after each menstrual period. Call your doctor  right away if you feel more of the IUD than just the threads or if you cannot feel the threads at all. The IUD may come out by itself. You may become pregnant if the device comes out. If you notice that the IUD has come out use a backup  birth control method like condoms and call your health care provider. Using tampons will not change the position of the IUD and are okay to use during your period. This IUD can be safely scanned with magnetic resonance imaging (MRI) only under specific conditions. Before you have an MRI, tell your healthcare provider that you have an IUD in place, and which type of IUD you have in place. What side effects may I notice from receiving this medicine? Side effects that you should report to your doctor or health care professional as soon as possible:  allergic reactions like skin rash, itching or hives, swelling of the face, lips, or tongue  fever, flu-like symptoms  genital sores  high blood pressure  no menstrual period for 6 weeks during use  pain, swelling, warmth in the leg  pelvic pain or tenderness  severe or sudden headache  signs of pregnancy  stomach cramping  sudden shortness of breath  trouble with balance, talking, or walking  unusual vaginal bleeding, discharge  yellowing of the eyes or skin Side effects that usually do not require medical attention (report to your doctor or health care professional if they continue or are bothersome):  acne  breast pain  change in sex drive or performance  changes in weight  cramping, dizziness, or faintness while the device is being inserted  headache  irregular menstrual bleeding within first 3 to 6 months of use  nausea This list may not describe all possible side effects. Call your doctor for medical advice about side effects. You may report side effects to FDA at 1-800-FDA-1088. Where should I keep my medicine? This does not apply. NOTE: This sheet is a summary. It may not cover all possible information. If you have questions about this medicine, talk to your doctor, pharmacist, or health care provider.  2021 Elsevier/Gold Standard (2019-12-09 16:27:45)

## 2020-06-01 NOTE — Progress Notes (Signed)
GYNECOLOGY ANNUAL PREVENTATIVE CARE ENCOUNTER NOTE  Subjective:   Briana Lara is a 25 y.o. G0P0000 female here for a routine annual gynecologic exam and her first pelvic exam.  Went to the ER recently with heavy bleeding from her irregular menstrual cycles.  Has continued to have bleeding daily since that visit.  Does not have any other doctors.  Knows that the ER told her she has high blood pressure.  Has been at her current weight for "awhile".  Is not gaining or losing despite measures she has taken at home..  Current complaints: extended vaginal bleeding.   Denies abnormal vaginal bleeding, discharge, pelvic pain, problems with intercourse or other gynecologic concerns.    Gynecologic History No LMP recorded. (Menstrual status: Irregular Periods). Contraception: none - female partner Last Pap: None  Obstetric History OB History  Gravida Para Term Preterm AB Living  0 0 0 0 0 0  SAB IAB Ectopic Multiple Live Births  0 0 0 0 0    Past Medical History:  Diagnosis Date  . Asthma   . Hypertension 05/31/2020  . Obesity     Past Surgical History:  Procedure Laterality Date  . nasal abscess    . TONSILECTOMY, ADENOIDECTOMY, BILATERAL MYRINGOTOMY AND TUBES    . TONSILLECTOMY      No current outpatient medications on file prior to visit.   No current facility-administered medications on file prior to visit.    Allergies  Allergen Reactions  . Mango Flavor Swelling    Social History   Socioeconomic History  . Marital status: Single    Spouse name: Not on file  . Number of children: Not on file  . Years of education: Not on file  . Highest education level: Not on file  Occupational History  . Not on file  Tobacco Use  . Smoking status: Current Every Day Smoker  . Smokeless tobacco: Never Used  Substance and Sexual Activity  . Alcohol use: No  . Drug use: Yes    Types: Marijuana  . Sexual activity: Yes    Birth control/protection: None  Other Topics  Concern  . Not on file  Social History Narrative   ** Merged History Encounter **       Social Determinants of Health   Financial Resource Strain: Not on file  Food Insecurity: Not on file  Transportation Needs: Not on file  Physical Activity: Not on file  Stress: Not on file  Social Connections: Not on file  Intimate Partner Violence: Not on file    History reviewed. No pertinent family history.  The following portions of the patient's history were reviewed and updated as appropriate: allergies, current medications, past family history, past medical history, past social history, past surgical history and problem list.  Review of Systems Pertinent items noted in HPI and remainder of comprehensive ROS otherwise negative.   Objective:  BP (!) 145/73   Pulse 81   Ht 5\' 4"  (1.626 m)   Wt (!) 388 lb 4.8 oz (176.1 kg)   BMI 66.65 kg/m  CONSTITUTIONAL: Well-developed, well-nourished female in no acute distress.  HENT:  Normocephalic, atraumatic, External right and left ear normal.  EYES: Conjunctivae and EOM are normal. Pupils are equal, round.  No scleral icterus.  NECK: Normal range of motion, supple, no masses.  Normal thyroid.  SKIN: Skin is warm and dry. No rash noted. Not diaphoretic. No erythema. No pallor. NEUROLOGIC: Alert and oriented to person, place, and time. Normal reflexes,  muscle tone coordination. No cranial nerve deficit noted. PSYCHIATRIC: Normal mood and affect. Normal behavior. Normal judgment and thought content. CARDIOVASCULAR: Normal heart rate noted, regular rhythm RESPIRATORY: Clear to auscultation bilaterally. Effort and breath sounds normal, no problems with respiration noted. BREASTS: Symmetric in size. No masses, skin changes, nipple drainage, or lymphadenopathy. ABDOMEN: Soft, no distention noted.  No tenderness, rebound or guarding.  PELVIC: Normal appearing external genitalia; normal appearing vaginal mucosa - cervix not visualized.  Bleeding noted.   Pap smear obtained. Exam limited due to habitus. MUSCULOSKELETAL: Normal range of motion. No tenderness.  No cyanosis, clubbing, or edema.    Assessment and Plan:  1. Well woman exam with routine gynecological exam Has a female partner and this is her first pelvic  - CBC - Cytology - PAP( [place]) - Hepatitis B Surface AntiGEN - Hepatitis C Antibody - HIV Antibody (routine testing w rflx) - RPR  2. Abnormal vaginal bleeding Will check for infection as a cause for the bleeding Will check hemoglobin but may be on the low side of normal due to chronic irregular bleeding  - CBC  3. Screening for STD (sexually transmitted disease) Has never had STD testing  - CBC - Cytology - PAP( [place]) - Hepatitis B Surface AntiGEN - Hepatitis C Antibody - HIV Antibody (routine testing w rflx) - RPR  4. History of irregular menstrual cycles Possibly due to obesity or possibly underlying PCOS  5. Encounter for BCP initial prescription Will presribe progestin only pills to control bleeding Discussed hormonal IUD as an option to control bleeding as well - to consider  6.  BMI 60-69 Weight loss advised  7  Elevated BP today - no current diagnosis of hypertension Advised to see [facility]CP - [facility]  Will follow up results of pap smear and manage accordingly. Routine preventative health maintenance measures emphasized. Please refer to After Visit Summary for other counseling recommendations.    Nolene Bernheim, RN, MSN, NP-BC Nurse Practitioner, [facility] for [facility]

## 2020-06-02 LAB — CBC
Hematocrit: 37.6 % (ref 34.0–46.6)
Hemoglobin: 12 g/dL (ref 11.1–15.9)
MCH: 25.2 pg — ABNORMAL LOW (ref 26.6–33.0)
MCHC: 31.9 g/dL (ref 31.5–35.7)
MCV: 79 fL (ref 79–97)
Platelets: 315 10*3/uL (ref 150–450)
RBC: 4.76 x10E6/uL (ref 3.77–5.28)
RDW: 14.2 % (ref 11.7–15.4)
WBC: 10.9 10*3/uL — ABNORMAL HIGH (ref 3.4–10.8)

## 2020-06-02 LAB — HIV ANTIBODY (ROUTINE TESTING W REFLEX): HIV Screen 4th Generation wRfx: NONREACTIVE

## 2020-06-02 LAB — HEPATITIS B SURFACE ANTIGEN: Hepatitis B Surface Ag: NEGATIVE

## 2020-06-02 LAB — RPR: RPR Ser Ql: NONREACTIVE

## 2020-06-02 LAB — HEPATITIS C ANTIBODY: Hep C Virus Ab: <0.1 s/co ratio (ref 0.0–0.9)

## 2020-06-04 LAB — CYTOLOGY - PAP
Chlamydia: NEGATIVE
Comment: NEGATIVE
Comment: NEGATIVE
Comment: NORMAL
Diagnosis: NEGATIVE
Neisseria Gonorrhea: NEGATIVE
Trichomonas: NEGATIVE

## 2020-08-30 ENCOUNTER — Ambulatory Visit: Payer: Medicaid Other

## 2021-10-24 ENCOUNTER — Ambulatory Visit (INDEPENDENT_AMBULATORY_CARE_PROVIDER_SITE_OTHER): Payer: Self-pay

## 2021-10-24 ENCOUNTER — Ambulatory Visit (HOSPITAL_COMMUNITY)
Admission: EM | Admit: 2021-10-24 | Discharge: 2021-10-24 | Disposition: A | Discharge status: Home / Self Care | Payer: Self-pay | Attending: Physician Assistant | Admitting: Physician Assistant

## 2021-10-24 ENCOUNTER — Encounter (HOSPITAL_COMMUNITY): Payer: Self-pay

## 2021-10-24 DIAGNOSIS — M25561 Pain in right knee: Secondary | ICD-10-CM

## 2021-10-24 DIAGNOSIS — W19XXXA Unspecified fall, initial encounter: Secondary | ICD-10-CM

## 2021-10-24 DIAGNOSIS — I10 Essential (primary) hypertension: Secondary | ICD-10-CM

## 2021-10-24 MED ORDER — AMLODIPINE BESYLATE 5 MG PO TABS: 5.0000 mg | ORAL_TABLET | Freq: Every day | ORAL | 0 refills | Status: DC

## 2021-10-24 MED ORDER — KETOROLAC TROMETHAMINE 30 MG/ML IJ SOLN
30.0000 mg | Freq: Once | INTRAMUSCULAR | Status: AC
  Administered 2021-10-24: 30 mg via INTRAMUSCULAR

## 2021-10-24 MED ORDER — KETOROLAC TROMETHAMINE 30 MG/ML IJ SOLN
INTRAMUSCULAR | Status: AC
  Filled 2021-10-24: qty 1

## 2021-10-24 NOTE — ED Provider Notes (Signed)
[facility]    CSN: [id number] Arrival date & time: 10/24/21  1919      History   Chief Complaint Chief Complaint  Patient presents with   Leg Pain    HPI Briana Lara is a 26 y.o. female.   Pt complains of right knee pain that started a few months ago after a fall.  She reports initially she was experiencing right knee bruising and difficulty bearing weight.  She reports now the knee hurts after she is on her feet for a long period of time and she has tightness with decreased ROM.  She has tried tylenol with minimal relief.  No other injuries.    Pt with elevated BP in clinic today.  She reports she has had elevated BP readings at other visits in the last few months.  She currently does not have a PCP.   BP Readings from Last 3 Encounters: 10/24/21 : (!) 169/99 06/01/20 : (!) 145/73 05/08/20 : (!) 144/100      Past Medical History:  Diagnosis Date   Asthma    Hypertension 05/31/2020   Obesity     Patient Active Problem List   Diagnosis Date Noted   Abnormal vaginal bleeding 06/01/2020   History of irregular menstrual cycles 06/01/2020   BMI 60.0-69.9, adult (HCC) 06/01/2020   Elevated BP without diagnosis of hypertension 06/01/2020   Asthma 05/31/2020   Acid reflux 05/31/2020   Acanthosis nigricans, acquired 05/31/2020   Hypertension 05/31/2020   Other specified behavioral and emotional disorders with onset usually occurring in childhood and adolescence 05/31/2020   Encounter for screening for respiratory tuberculosis 05/31/2020    Past Surgical History:  Procedure Laterality Date   nasal abscess     TONSILECTOMY, ADENOIDECTOMY, BILATERAL MYRINGOTOMY AND TUBES     TONSILLECTOMY      OB History     Gravida  0   Para  0   Term  0   Preterm  0   AB  0   Living  0      SAB  0   IAB  0   Ectopic  0   Multiple  0   Live Births  0            Home Medications    Prior to Admission medications   Medication Sig Start  Date End Date Taking? Authorizing Provider  amLODipine (NORVASC) 5 MG tablet Take 1 tablet (5 mg total) by mouth daily. 10/24/21 11/23/21 Yes Ward, Tylene Fantasia, PA-C  norethindrone (MICRONOR) 0.35 MG tablet Take 1 tablet (0.35 mg total) by mouth daily. 06/01/20   Currie Paris, NP    Family History History reviewed. No pertinent family history.  Social History Social History   Tobacco Use   Smoking status: Every Day   Smokeless tobacco: Never  Substance Use Topics   Alcohol use: No   Drug use: Yes    Types: Marijuana     Allergies   Mango flavor   Review of Systems Review of Systems  Constitutional:  Negative for chills and fever.  HENT:  Negative for ear pain and sore throat.   Eyes:  Negative for pain and visual disturbance.  Respiratory:  Negative for cough and shortness of breath.   Cardiovascular:  Negative for chest pain and palpitations.  Gastrointestinal:  Negative for abdominal pain and vomiting.  Genitourinary:  Negative for dysuria and hematuria.  Musculoskeletal:  Positive for arthralgias. Negative for back pain.  Skin:  Negative  for color change and rash.  Neurological:  Negative for seizures and syncope.  All other systems reviewed and are negative.    Physical Exam Triage Vital Signs ED Triage Vitals  Enc Vitals Group     BP 10/24/21 1952 (!) 169/99     Pulse Rate 10/24/21 1952 74     Resp 10/24/21 1952 16     Temp 10/24/21 1952 98.5 F (36.9 C)     Temp Source 10/24/21 1952 Oral     SpO2 10/24/21 1952 99 %     Weight 10/24/21 1949 (!) 350 lb (158.8 kg)     Height 10/24/21 1949 5\' 5"  (1.651 m)     Head Circumference --      Peak Flow --      Pain Score 10/24/21 1949 10     Pain Loc --      Pain Edu? --      Excl. in GC? --    No data found.  Updated Vital Signs BP (!) 169/99 (BP Location: Right Wrist)   Pulse 74   Temp 98.5 F (36.9 C) (Oral)   Resp 16   Ht 5\' 5"  (1.651 m)   Wt (!) 350 lb (158.8 kg)   LMP 10/01/2021 (Within Days)    SpO2 99%   BMI 58.24 kg/m   Visual Acuity Right Eye Distance:   Left Eye Distance:   Bilateral Distance:    Right Eye Near:   Left Eye Near:    Bilateral Near:     Physical Exam Vitals and nursing note reviewed.  Constitutional:      General: She is not in acute distress.    Appearance: She is well-developed.  HENT:     Head: Normocephalic and atraumatic.  Eyes:     Conjunctiva/sclera: Conjunctivae normal.  Cardiovascular:     Rate and Rhythm: Normal rate and regular rhythm.     Heart sounds: No murmur heard. Pulmonary:     Effort: Pulmonary effort is normal. No respiratory distress.     Breath sounds: Normal breath sounds.  Abdominal:     Palpations: Abdomen is soft.     Tenderness: There is no abdominal tenderness.  Musculoskeletal:        General: No swelling.     Cervical back: Neck supple.     Comments: Right knee joint line tenderness.  No swelling, redness, or warmth noted. Normal passive ROM.  Normal strength.   Skin:    General: Skin is warm and dry.     Capillary Refill: Capillary refill takes less than 2 seconds.  Neurological:     Mental Status: She is alert.  Psychiatric:        Mood and Affect: Mood normal.      UC Treatments / Results  Labs (all labs ordered are listed, but only abnormal results are displayed) Labs Reviewed - No data to display  EKG   Radiology No results found.  Procedures Procedures (including critical care time)  Medications Ordered in UC Medications  ketorolac (TORADOL) 30 MG/ML injection 30 mg (30 mg Intramuscular Given 10/24/21 2023)    Initial Impression / Assessment and Plan / UC Course  I have reviewed the triage vital signs and the nursing notes.  Pertinent labs & imaging results that were available during my care of the patient were reviewed by me and considered in my medical decision making (see chart for details).     Imaging negative for fracture.  Toradol given.  Discussed supportive  care.  Ace wrap  given in clinic.   Given elevated pressure today and elevated readings over the last few months will start her on BP medication.  Discussed the importance of follow up with PCP.  Resources given.  ED precautions discussed.  Final Clinical Impressions(s) / UC Diagnoses   Final diagnoses:  Primary hypertension  Acute pain of right knee     Discharge Instructions      Follow up with Primary Care regarding elevated BP, take medication as prescribed Follow up with orthopedics if you are experiencing continued knee pain.      ED Prescriptions     Medication Sig Dispense Auth. Provider   amLODipine (NORVASC) 5 MG tablet Take 1 tablet (5 mg total) by mouth daily. 30 tablet Ward, Lenise Arena, PA-C      PDMP not reviewed this encounter.   Ward, Lenise Arena, PA-C 11/01/21 1944

## 2021-10-24 NOTE — Discharge Instructions (Addendum)
Follow up with Primary Care regarding elevated BP, take medication as prescribed Follow up with orthopedics if you are experiencing continued knee pain.

## 2021-10-24 NOTE — ED Triage Notes (Signed)
Patient fell and hurt her leg a couple of months ago. Pain has been ongoing. Patient states that when this happened she was not seen for this. Thr right leg was bruised and could not bear weight.   Patient states the right leg has started to get tight, can not bear weight for long, reduced range of motion(can not bend fully at the knee).   Non-stop aching pain.

## 2021-10-27 ENCOUNTER — Encounter: Payer: Self-pay | Admitting: Emergency Medicine

## 2022-01-09 ENCOUNTER — Emergency Department (HOSPITAL_COMMUNITY)
Admission: EM | Admit: 2022-01-09 | Discharge: 2022-01-10 | Discharge status: Against Medical Advice | Payer: Medicaid Other | Attending: Emergency Medicine | Admitting: Emergency Medicine

## 2022-01-09 ENCOUNTER — Emergency Department (HOSPITAL_COMMUNITY): Payer: Medicaid Other

## 2022-01-09 ENCOUNTER — Encounter (HOSPITAL_COMMUNITY): Payer: Self-pay | Admitting: Emergency Medicine

## 2022-01-09 DIAGNOSIS — H538 Other visual disturbances: Secondary | ICD-10-CM | POA: Insufficient documentation

## 2022-01-09 DIAGNOSIS — R519 Headache, unspecified: Secondary | ICD-10-CM | POA: Insufficient documentation

## 2022-01-09 DIAGNOSIS — Z5321 Procedure and treatment not carried out due to patient leaving prior to being seen by health care provider: Secondary | ICD-10-CM | POA: Insufficient documentation

## 2022-01-09 LAB — CBC WITH DIFFERENTIAL/PLATELET
Abs Immature Granulocytes: 0.06 10*3/uL (ref 0.00–0.07)
Basophils Absolute: 0 10*3/uL (ref 0.0–0.1)
Basophils Relative: 0 %
Eosinophils Absolute: 0.3 10*3/uL (ref 0.0–0.5)
Eosinophils Relative: 2 %
HCT: 38.1 % (ref 36.0–46.0)
Hemoglobin: 11.1 g/dL — ABNORMAL LOW (ref 12.0–15.0)
Immature Granulocytes: 1 %
Lymphocytes Relative: 38 %
Lymphs Abs: 4.6 10*3/uL — ABNORMAL HIGH (ref 0.7–4.0)
MCH: 23.3 pg — ABNORMAL LOW (ref 26.0–34.0)
MCHC: 29.1 g/dL — ABNORMAL LOW (ref 30.0–36.0)
MCV: 79.9 fL — ABNORMAL LOW (ref 80.0–100.0)
Monocytes Absolute: 0.5 10*3/uL (ref 0.1–1.0)
Monocytes Relative: 4 %
Neutro Abs: 6.6 10*3/uL (ref 1.7–7.7)
Neutrophils Relative %: 55 %
Platelets: 336 10*3/uL (ref 150–400)
RBC: 4.77 MIL/uL (ref 3.87–5.11)
RDW: 15.2 % (ref 11.5–15.5)
WBC: 12.1 10*3/uL — ABNORMAL HIGH (ref 4.0–10.5)
nRBC: 0 % (ref 0.0–0.2)

## 2022-01-09 LAB — COMPREHENSIVE METABOLIC PANEL
ALT: 15 U/L (ref 0–44)
AST: 16 U/L (ref 15–41)
Albumin: 3.8 g/dL (ref 3.5–5.0)
Alkaline Phosphatase: 54 U/L (ref 38–126)
Anion gap: 8 (ref 5–15)
BUN: 10 mg/dL (ref 6–20)
CO2: 30 mmol/L (ref 22–32)
Calcium: 9.2 mg/dL (ref 8.9–10.3)
Chloride: 104 mmol/L (ref 98–111)
Creatinine, Ser: 0.63 mg/dL (ref 0.44–1.00)
GFR, Estimated: >60 mL/min (ref >60)
Glucose, Bld: 105 mg/dL — ABNORMAL HIGH (ref 70–99)
Potassium: 3.8 mmol/L (ref 3.5–5.1)
Sodium: 142 mmol/L (ref 135–145)
Total Bilirubin: 0.3 mg/dL (ref 0.3–1.2)
Total Protein: 7.2 g/dL (ref 6.5–8.1)

## 2022-01-09 LAB — I-STAT BETA HCG BLOOD, ED (MC, WL, AP ONLY): I-stat hCG, quantitative: <5 m[IU]/mL (ref <5)

## 2022-01-09 NOTE — ED Provider Triage Note (Signed)
Emergency Medicine Provider Triage Evaluation Note  Briana Lara , a 26 y.o. female  was evaluated in triage.  Pt complains of worsening headache over the last week with moments of blurred vision.  Has tried Excedrin without relief.  Described as a throbbing/pounding sensation.  Does not usually get headaches.  Denies fevers or neck stiffness.  Vision changes occur when she tries to focus on her phone, closes her eyes for a second or 2, and her vision is restored.  Denies active blurry vision or vision changes at this time.  Has been out of her unknown BP medication for the last month.  Denies any other neurodeficits, difficulty walking, or weakness.  Review of Systems  Positive:  Negative: See above  Physical Exam  BP (!) 161/119 (BP Location: Right Arm)   Pulse 72   Temp 99.2 F (37.3 C) (Oral)   Resp 20   LMP 01/02/2022   SpO2 100%  Gen:   Awake, no distress, sitting in dark room Resp:  Normal effort  MSK:   Moves extremities without difficulty  Other:  PERRLA.  Without Templar tenderness.  Gaze aligned appropriately.  AAOx4.  Medical Decision Making  Medically screening exam initiated at 7:31 PM.  Appropriate orders placed.  Briana Lara was informed that the remainder of the evaluation will be completed by another provider, this initial triage assessment does not replace that evaluation, and the importance of remaining in the ED until their evaluation is complete.     Prince Rome, PA-C 29/92/42 1935

## 2022-01-09 NOTE — ED Triage Notes (Addendum)
Headaches x 1 week- taking excedrin. Frontal and behind right eye throbbing as well as nose. Denies URI symptoms.  Was taking BP meds but ran out about a month ago. Reports blurred vision that does get better when she closes her eyes.

## 2022-01-13 ENCOUNTER — Ambulatory Visit (HOSPITAL_COMMUNITY)
Admission: EM | Admit: 2022-01-13 | Discharge: 2022-01-13 | Disposition: A | Discharge status: Home / Self Care | Payer: Medicaid Other | Attending: Physician Assistant | Admitting: Physician Assistant

## 2022-01-13 ENCOUNTER — Encounter (HOSPITAL_COMMUNITY): Payer: Self-pay

## 2022-01-13 DIAGNOSIS — I10 Essential (primary) hypertension: Secondary | ICD-10-CM

## 2022-01-13 DIAGNOSIS — G4452 New daily persistent headache (NDPH): Secondary | ICD-10-CM

## 2022-01-13 MED ORDER — AMLODIPINE BESYLATE 10 MG PO TABS: 10.0000 mg | ORAL_TABLET | Freq: Every day | ORAL | 0 refills | Status: AC

## 2022-01-13 MED ORDER — BACLOFEN 10 MG PO TABS: 10.0000 mg | ORAL_TABLET | Freq: Two times a day (BID) | ORAL | 0 refills | Status: DC

## 2022-01-13 NOTE — ED Provider Notes (Signed)
[facility]    CSN: [id number] Arrival date & time: 01/13/22  [id number]      History   Chief Complaint Chief Complaint  Patient presents with   Headache    HPI Briana Lara is a 26 y.o. female.   Patient presents today accompanied by her girlfriend to help provide the majority of history.  Reports a 2-week history of severe persistent headache.  Symptoms were severe enough that she went to the emergency room on 01/09/2022 at which point she had a negative head CT and lab work was unremarkable other than mild anemia.  Unfortunately, the wait was long and she was not seen by provider.  Since that time she has had ongoing pain.  This is primarily on the right side of her head and described as intense throbbing.  She does report associated photophobia but denies any nausea, vomiting, dysarthria, weakness.  She denies history of migraines.  Reports that symptoms began soon after she stopped taking her blood pressure medication because she ran out.  She does not currently have a primary care provider but is interested in establishing with someone.  She is also requesting that we refill her amlodipine.  Reports that she has been taking high doses of Excedrin and Tylenol.  These will provide temporary relief of symptoms only to have recurrent severe headache as soon as the medication wears off.  She denies any recent head injury or medication changes.    Past Medical History:  Diagnosis Date   Asthma    Hypertension 05/31/2020   Obesity     Patient Active Problem List   Diagnosis Date Noted   Abnormal vaginal bleeding 06/01/2020   History of irregular menstrual cycles 06/01/2020   BMI 60.0-69.9, adult (HCC) 06/01/2020   Elevated BP without diagnosis of hypertension 06/01/2020   Asthma 05/31/2020   Acid reflux 05/31/2020   Acanthosis nigricans, acquired 05/31/2020   Hypertension 05/31/2020   Other specified behavioral and emotional disorders with onset usually occurring in  childhood and adolescence 05/31/2020   Encounter for screening for respiratory tuberculosis 05/31/2020    Past Surgical History:  Procedure Laterality Date   nasal abscess     TONSILECTOMY, ADENOIDECTOMY, BILATERAL MYRINGOTOMY AND TUBES     TONSILLECTOMY      OB History     Gravida  0   Para  0   Term  0   Preterm  0   AB  0   Living  0      SAB  0   IAB  0   Ectopic  0   Multiple  0   Live Births  0            Home Medications    Prior to Admission medications   Medication Sig Start Date End Date Taking? Authorizing Provider  baclofen (LIORESAL) 10 MG tablet Take 1 tablet (10 mg total) by mouth 2 (two) times daily. 01/13/22  Yes Keeyon Privitera K, PA-C  amLODipine (NORVASC) 10 MG tablet Take 1 tablet (10 mg total) by mouth daily. 01/13/22 04/13/22  Shaine Newmark, Noberto RetortErin K, PA-C  norethindrone (MICRONOR) 0.35 MG tablet Take 1 tablet (0.35 mg total) by mouth daily. 06/01/20   Currie ParisBurleson, Terri L, NP    Family History History reviewed. No pertinent family history.  Social History Social History   Tobacco Use   Smoking status: Every Day   Smokeless tobacco: Never  Substance Use Topics   Alcohol use: No   Drug use: Yes  Types: Marijuana     Allergies   Mango flavor   Review of Systems Review of Systems  Constitutional:  Positive for activity change. Negative for appetite change, fatigue and fever.  Eyes:  Positive for photophobia. Negative for visual disturbance.  Respiratory:  Negative for shortness of breath.   Cardiovascular:  Negative for chest pain.  Gastrointestinal:  Negative for abdominal pain, diarrhea, nausea and vomiting.  Neurological:  Positive for headaches. Negative for dizziness, syncope, facial asymmetry, speech difficulty, weakness, light-headedness and numbness.     Physical Exam Triage Vital Signs ED Triage Vitals  Enc Vitals Group     BP 01/13/22 0836 (!) 155/113     Pulse Rate 01/13/22 0831 79     Resp 01/13/22 0831 16      Temp 01/13/22 0831 97.8 F (36.6 C)     Temp Source 01/13/22 0831 Oral     SpO2 01/13/22 0831 100 %     Weight --      Height --      Head Circumference --      Peak Flow --      Pain Score 01/13/22 0835 10     Pain Loc --      Pain Edu? --      Excl. in GC? --    No data found.  Updated Vital Signs BP (!) 155/113 (BP Location: Left Arm)   Pulse 79   Temp 97.8 F (36.6 C) (Oral)   Resp 16   LMP 01/02/2022   SpO2 100%   Visual Acuity Right Eye Distance:   Left Eye Distance:   Bilateral Distance:    Right Eye Near:   Left Eye Near:    Bilateral Near:     Physical Exam Vitals reviewed.  Constitutional:      General: She is awake. She is not in acute distress.    Appearance: Normal appearance. She is well-developed. She is not ill-appearing.     Comments: Very pleasant female appears stated age in no acute distress sitting comfortably in exam room  HENT:     Head: Normocephalic and atraumatic. No raccoon eyes, Battle's sign or contusion.     Right Ear: Tympanic membrane, ear canal and external ear normal. No hemotympanum.     Left Ear: Tympanic membrane, ear canal and external ear normal. No hemotympanum.     Nose: Nose normal.     Mouth/Throat:     Tongue: Tongue does not deviate from midline.     Pharynx: Uvula midline. No oropharyngeal exudate or posterior oropharyngeal erythema.  Eyes:     Extraocular Movements: Extraocular movements intact.     Conjunctiva/sclera: Conjunctivae normal.     Pupils: Pupils are equal, round, and reactive to light.  Cardiovascular:     Rate and Rhythm: Normal rate and regular rhythm.     Heart sounds: Normal heart sounds, S1 normal and S2 normal. No murmur heard. Pulmonary:     Effort: Pulmonary effort is normal.     Breath sounds: Normal breath sounds. No wheezing, rhonchi or rales.     Comments: Clear to auscultation bilaterally Musculoskeletal:     Cervical back: Normal range of motion and neck supple. No tenderness or bony  tenderness.     Thoracic back: No tenderness or bony tenderness.     Lumbar back: No tenderness or bony tenderness.     Comments: Strength 5/5 bilateral upper and lower extremities  Lymphadenopathy:     Head:  Right side of head: No submental, submandibular or tonsillar adenopathy.     Left side of head: No submental, submandibular or tonsillar adenopathy.  Neurological:     General: No focal deficit present.     Cranial Nerves: Cranial nerves 2-12 are intact.     Motor: Motor function is intact.     Coordination: Coordination is intact. Romberg sign negative.     Gait: Gait is intact.     Comments: No focal neurological defect on exam.  Psychiatric:        Behavior: Behavior is cooperative.      UC Treatments / Results  Labs (all labs ordered are listed, but only abnormal results are displayed) Labs Reviewed - No data to display  EKG   Radiology No results found.  Procedures Procedures (including critical care time)  Medications Ordered in UC Medications - No data to display  Initial Impression / Assessment and Plan / UC Course  I have reviewed the triage vital signs and the nursing notes.  Pertinent labs & imaging results that were available during my care of the patient were reviewed by me and considered in my medical decision making (see chart for details).     I suspect headache is multifactorial related to uncontrolled blood pressure that has led to increased use of over-the-counter medications resulting in a rebound headache syndrome.  Discussed that she would need to restart her blood pressure medication and this was sent to the pharmacy.  Recommend that she decrease the use of over-the-counter medications.  Will use baclofen to help bridge during this time.  Discussed the sedating she should not drive or drink alcohol with taking it.  Also recommended magnesium supplement to prevent recurrent headaches.  She is to rest and drink plenty of fluid.  Discussed the  importance of avoiding salt as this could be a trigger of her blood pressure and headache.  She does not currently have a primary care provider so we will try to establish her with 1 via PCP assistance.  Blood pressure is elevated today but she denies any signs/symptoms of endorgan damage other than headache and reports that this has been ongoing for several weeks and she has had a negative head CT.  She is not present and going to the emergency room for further evaluation.  We did discuss that if she has any worsening symptoms including headache becomes the worst of her life, dysarthria, weakness, nausea, vomiting, chest pain, shortness of breath she needs to go to the emergency room immediately to which she expressed understanding.  Strict return precautions given.  Work excuse note provided.  Final Clinical Impressions(s) / UC Diagnoses   Final diagnoses:  New daily persistent headache  Elevated blood pressure reading in office with diagnosis of hypertension     Discharge Instructions      I think there are several things causing your headache.  Please limit use of over-the-counter medications including Excedrin.  Start baclofen twice a day to help with your headache.  This make you sleepy do not drive or drink alcohol while taking it.  Make sure you are resting and drinking plenty of fluid.  Avoid high salt foods.  Restart your amlodipine.  Please follow-up with our clinic if you are unable to see primary care within a few weeks.  Someone should reach out to schedule an appointment with primary care.  If anything worsens and you have the worst like of your life, weakness, nausea/vomiting, chest pain, shortness of breath,  difficulty speaking, confusion you need to go to the emergency room immediately.     ED Prescriptions     Medication Sig Dispense Auth. Provider   amLODipine (NORVASC) 10 MG tablet Take 1 tablet (10 mg total) by mouth daily. 90 tablet Reniyah Gootee K, PA-C   baclofen  (LIORESAL) 10 MG tablet Take 1 tablet (10 mg total) by mouth 2 (two) times daily. 30 each Nathan Moctezuma, Noberto Retort, PA-C      PDMP not reviewed this encounter.   Jeani Hawking, PA-C 01/13/22 2248

## 2022-01-13 NOTE — ED Triage Notes (Signed)
Pt states headache for the past month states she is out of her BP meds. States she is taking Excedrin with no relief.  Seen in the ED last night for same but left AMA. States she had a head CT and lab work done.

## 2022-01-13 NOTE — Discharge Instructions (Signed)
I think there are several things causing your headache.  Please limit use of over-the-counter medications including Excedrin.  Start baclofen twice a day to help with your headache.  This make you sleepy do not drive or drink alcohol while taking it.  Make sure you are resting and drinking plenty of fluid.  Avoid high salt foods.  Restart your amlodipine.  Please follow-up with our clinic if you are unable to see primary care within a few weeks.  Someone should reach out to schedule an appointment with primary care.  If anything worsens and you have the worst like of your life, weakness, nausea/vomiting, chest pain, shortness of breath, difficulty speaking, confusion you need to go to the emergency room immediately.

## 2022-03-27 NOTE — Progress Notes (Signed)
Erroneous encounter-disregard

## 2022-04-03 ENCOUNTER — Encounter: Payer: Self-pay | Admitting: Family

## 2022-04-03 DIAGNOSIS — Z7689 Persons encountering health services in other specified circumstances: Secondary | ICD-10-CM

## 2023-09-03 ENCOUNTER — Ambulatory Visit (HOSPITAL_COMMUNITY)
Admission: EM | Admit: 2023-09-03 | Discharge: 2023-09-03 | Disposition: A | Discharge status: Home / Self Care | Payer: Self-pay | Attending: Family Medicine | Admitting: Family Medicine

## 2023-09-03 ENCOUNTER — Other Ambulatory Visit (HOSPITAL_COMMUNITY): Payer: Self-pay

## 2023-09-03 ENCOUNTER — Encounter (HOSPITAL_COMMUNITY): Payer: Self-pay | Admitting: Emergency Medicine

## 2023-09-03 DIAGNOSIS — K0889 Other specified disorders of teeth and supporting structures: Secondary | ICD-10-CM

## 2023-09-03 MED ORDER — AMOXICILLIN-POT CLAVULANATE 875-125 MG PO TABS
1.0000 | ORAL_TABLET | Freq: Two times a day (BID) | ORAL | 0 refills | Status: AC
  Filled 2023-09-03: qty 20, 10d supply, fill #0

## 2023-09-03 NOTE — Discharge Instructions (Signed)
 You were seen today for a dental infection.  I have sent an antibiotic to your pharmacy.  You may take motrin  for pain and swelling.  Please follow up with a dentist.  I have given you several lists of dental resources.       [facility] Adult Primary Care, Pharmacy [address] [place], [place] [address] Visit Website [phone]  [facility] Adult Primary Care, Mental Health [address] [place], [place] [address] Visit Website [facility] [phone]  [facility] [address], [place] [address] Visit Website Buckley Card [phone]  [facility] Adult Primary Care, Dental, Pharmacy, Mental Health [address] [place], [place] [address] Visit Website Patrica Bookman [phone]  [facility] Adult Primary Care, Mental Health [address] [place], [place] [address] Visit Website Deann Exon, [place], [place], [place] [phone]  [facility] Adult Primary Care, Pharmacy, Mental Health [address] [place], [place] [address] Visit Website Ellsworth Haas, [place] [phone]  [facility] [employer] Adult Primary Care, Dental, Pharmacy [address] [place], [place], [place]  Visit Website Marcus [place], [place] [phone]  [facility] Adult Primary Care, Dental, Pharmacy [address], [place] [address] Visit Website Conception Decree [phone]  [facility] [employer] [address] [place], [place] [address] Visit Website [place] [phone]  [facility] Adult Primary Care [address] [place], [place] [address] Visit Website Villal[place], [place], [place] [phone]  [facility] Adult Primary Care, Dental, Pharmacy, Mental Health [address] [place], [place] [address] Visit Website Arlina Benjamin, [place] [phone]  [facility] Adult Primary Care, Mental Health [address] [place], [place] [address] Visit Website [place], [place], Laurina Popper [phone]  [facility] Adult Primary Care, Dental, Pharmacy, Mental Health [address] Adrianne [place], [place] [address] Visit Website [place], Barnabas Lia, [place]  [phone]  [facility], Pharmacy [address] [place], [place] [address] Visit Website Artice Last, Arlice Bene [phone]  [facility] [place] Adult Primary Care, Pharmacy, Mental Health [address] [place], [place] [address] Visit Website Elba Greathouse [phone]  [facility] Adult Primary Care, Dental, Pharmacy [address] [place], [place] [address] Visit Website Clair Crews [phone]  [facility] Adult Primary Care, Pharmacy, Mental Health [address] [place], [place] [address] Visit Website Gay Katayama [phone]  [facility] Adult Primary Care, Pharmacy [address] [place], [place] [address] Visit Website Argyle [phone]  [facility] Adult Primary Care, Pharmacy [address] [place], [place] [address] Visit Website Stevens Eland [phone]  [facility] ([facility] Adult Primary Care, Dental [phone] [place], [place] [address] Visit Website Starr Eddy [phone]  [facility] Pharmacy [address] [place], [place] [address] Visit Website Dozier Genre [phone]  [facility] Adult Primary Care [address] [place], [place] [address] Visit Website [place], [place], [place], [place], [place], [place] [phone]  [facility] [address] [facility] Adult Primary Care, Mental Health [address] [place], [place] [address] Visit Website Michaeleen Adler [phone]  [facility] Adult Primary Care, Pharmacy, Mental Health [address] [place], [place] [address] Visit Website [place] [phone]  [facility] Adult Primary Care, Pharmacy, Mental Health [address] [place], [place] [address] Visit Website Judie Noun, [place] [phone]  [facility] Adult Primary Care, Pharmacy, Mental Health [address] [place], [place] [address] Visit Website Idolina Maker, [address] [place], Fabio Holts [phone]  [facility] [employer] Adult Primary Care, Dental, Pharmacy, Mental Health [address] [place], [place] [address] Visit Website Franklin Ito, [place] [phone]  [facility] Adult Primary Care [address] [place], [place] [address] Visit Website Kristin Peyer [phone]  [facility] Adult Primary Care, Pharmacy, Mental Health [address] [place], [place] [address] Visit Website Jann Melody [phone]  Healing with Jerone Moorman. Dental, Mental Health  [address] [place], [place] [address] Visit Website [place] [phone]  [facility] [address] ([facility]. [facility]) Adult Primary Care, Pharmacy [address], [place] [address] Visit Website [place], [place], [place], [place], Marissa Sida [phone]  [facility] Pharm[facility] [address], [place] [address] Visit Website Mardene Shake [phone]  [facility] Adult Primary Care, Pharmacy, Mental Health [address] [place], [place] [address] Visit Website Michaeleen Adler [phone]  [facility] Adult Primary Care, Pharmacy [address] [place] [address] Visit Website [place], [place] [phone]  [facility] [employer] [facility] Adult Primary Care, Pharmacy, Mental Health [address] [place], [place] [address] Visit Website Doree Games [phone]  [facility] Adult Primary Care, Pharmacy [address] [place], [place] [address] Visit Website Raejean Bullock [phone]  [facility] Adult Primary Care, Pharmacy, Mental Health [address] [place], [place] [address] Visit Website Awilda Bogus, [place] [phone]  [facility] Pharm[facility] [address] [place], [place] [address] Visit Website Fronie Jewett [phone]  Jeneal Mins [facility] Adult Primary Care, Pharmacy [address] [place], [place] [address] Visit Website [place] [phone]  [facility] Adult Primary Care, Mental Health [address] Amy Kans[address] [place] [address] Visit Website Jone Neither [phone]  [facility] Adult Primary Care, Dental, Pharmacy, Mental Health [address] [place] [address] Visit Website [place], [place] [phone]  [facility] Pharmacy [address] [place], [place] [address] Visit Website Rosaria Common, Carolynn Citrin, [place] [phone]  [facility] Adult Primary Care, Dental, Pharmacy [address] Sharron [place], [place] [address] Visit Website Chase Copping [phone]  [facility] Adult Primary Care, Pharmacy, Mental Health [address], [place] [address] Visit Website Alverda Joe [phone]  Sh[facility] Adult Primary Care, Mental Health [address] [place], [place] [address] Visit Website [place] [phone]  [facility] [address] [place], [place] [address] Visit Website [place], [place] [address] counties [phone]  [facility] Adult Primary Care, Mental Health [address] [place], [place] [address] Visit  Website [place] [phone]  [facility] [address] [place], [place] [address] Visit Website Katharina Palin, Kristeen Peto [phone]  [facility] Adult Primary Care, Pharmacy [address] [place], [place] [address] Visit Website [place], [place], [place], [place], [place] [phone]  [facility] Pharmacy [address]  [place], [place] [address] Visit Website Henderson Lock, [place], [place], [place] [phone]  [facility]ist Pharmacy [address] [place], [place] [address] Visit Website [place] [phone]  [facility] Adult Primary Care, Pharmacy [address] [place], [place] [address] Visit Website Janise Melia, [address] [place], [place], [place], [place] Iba [phone]  [facility] Adult Primary Care, Pharmacy [address], [place] [address] Visit Website [place] [phone]  [facility] Adult Primary Care, Pharmacy [address] [place], [place] [address] Visit Website Reymundo Caulk [phone]  [facility] Adult Primary Care, Pharmacy, Mental Health [address] [place], [place] [address] Visit Website Nicki Barnacle [phone]  [facility] Adult Primary Care, Pharmacy, Mental Health [address] [place], [place], [place] [address] Visit Website [facility] [phone]  Adult Primary Care, Pharmacy, Mental Health [address] [place], [place] [address] Visit Website [place], [place], [place], [place], [place], [place], [place], [place] [phone]  [facility] Adult Primary Care 1 Medical [address] [place], [place], [place] [address] Visit Website Ruben Corolla, Dallie Duel, [address] [place], [place], [place], [place], [place] Sicilian [phone]  [facility] Dental [address] [place], [place] [address] Visit Website [facility] [phone]  [facility] Adult Primary Care, Pharmacy [address] [place], [place] [address] Visit Website [place] [phone]

## 2023-09-03 NOTE — ED Provider Notes (Signed)
 [facility]    CSN: [id number] Arrival date & time: 09/03/23  1054      History   Chief Complaint Chief Complaint  Patient presents with   Dental Pain    HPI Briana Lara is a 28 y.o. female.    Dental Pain  Patient is here for dental pain/infection.  Started with pain about 5 days after eating something hard.  She has several broken teeth in the area.  Having some pain and swelling.  Low grade fever last night.  No n/v.         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 Authorizing Provider  amoxicillin-clavulanate (AUGMENTIN) 875-125 MG tablet Take 1 tablet by mouth every 12 (twelve) hours for 10 days. 09/03/23 09/13/23 Yes Hollis Tuller, MD  amLODipine  (NORVASC ) 10 MG tablet Take 1 tablet (10 mg total) by mouth daily. 01/13/22 04/13/22  Raspet, Betsey Brow, PA-C    Family History No family history on file.  Social  History Social History   Tobacco Use   Smoking status: Every Day   Smokeless tobacco: Never  Substance Use Topics   Alcohol use: No   Drug use: Yes    Types: Marijuana     Allergies   Mango flavoring agent (non-screening)   Review of Systems Review of Systems  Constitutional: Negative.   HENT:  Positive for dental problem.   Cardiovascular: Negative.   Gastrointestinal: Negative.   Musculoskeletal: Negative.   Psychiatric/Behavioral: Negative.       Physical Exam Triage Vital Signs ED Triage Vitals  Encounter Vitals Group     BP 09/03/23 1154 (!) 172/94     Systolic BP Percentile --      Diastolic BP Percentile --      Pulse Rate 09/03/23 1154 72     Resp 09/03/23 1154 18     Temp 09/03/23 1154 98.1 F (36.7 C)     Temp Source 09/03/23 1154 Oral     SpO2 09/03/23 1154 97 %     Weight --      Height --      Head Circumference --      Peak Flow --  Pain Score 09/03/23 1153 9     Pain Loc --      Pain Education --      Exclude from Growth Chart --    No data found.  Updated Vital Signs BP (!) 172/94 (BP Location: Left Arm)   Pulse 72   Temp 98.1 F (36.7 C) (Oral)   Resp 18   LMP 08/02/2023   SpO2 97%   Visual Acuity Right Eye Distance:   Left Eye Distance:   Bilateral Distance:    Right Eye Near:   Left Eye Near:    Bilateral Near:     Physical Exam Constitutional:      General: She is not in acute distress.    Appearance: Normal appearance. She is normal weight. She is not ill-appearing or toxic-appearing.  HENT:     Head:     Comments: Slight swelling to the right lower jaw;  several broken molars at the right lower jaw line; Cardiovascular:     Rate and Rhythm: Normal rate and regular rhythm.  Pulmonary:     Effort: Pulmonary effort is normal.     Breath sounds: Normal breath sounds.  Neurological:     General: No focal deficit present.     Mental Status: She is alert.  Psychiatric:        Mood and Affect: Mood normal.       UC Treatments / Results  Labs (all labs ordered are listed, but only abnormal results are displayed) Labs Reviewed - No data to display  EKG   Radiology No results found.  Procedures Procedures (including critical care time)  Medications Ordered in UC Medications - No data to display  Initial Impression / Assessment and Plan / UC Course  I have reviewed the triage vital signs and the nursing notes.  Pertinent labs & imaging results that were available during my care of the patient were reviewed by me and considered in my medical decision making (see chart for details).    Final Clinical Impressions(s) / UC Diagnoses   Final diagnoses:  Pain, dental   Discharge Instructions      You were seen today for a dental infection.  I have sent an antibiotic to your pharmacy.  You may take motrin  for pain and swelling.  Please follow up with a dentist.  I have given you several lists of dental resources.       [facility] Adult Primary Care, Pharmacy [address] [place] [address] Visit Website [phone]  [facility] Adult Primary Care, Mental Health [address] [place], [place] [address] Visit Website [facility] [phone]  [facility] [address] [place] [address] Visit Website Buckley Card [phone]  [facility] Adult Primary Care, Dental, Pharmacy, Mental Health [address] [place], [place] [address] Visit Website Patrica Bookman [phone]  [facility] Adult Primary Care, Mental Health [address] [place], [place] [address] Visit Website Deann Exon, Lahoma Pigg, [place], [place] [phone]  [facility] Adult Primary Care, Pharmacy, Mental Health [address] [place], [place] [address] Visit Website Ellsworth Haas, [place] [phone]  [facility] Adult Primary Care, Dental, Pharmacy [address] [place], [place], [place]  Visit Website Irvine Mantis [place], [place] [phone]  [facility] Adult Primary Care, Dental, Pharmacy [address], [place] [address] Visit Website Conception Decree [phone]  [facility] [employer] [address] [place], [place] [address] Visit Website [place] [phone]  [facility] Adult [facility] Care [address] [place], [place] [address] Visit Website [place], [place], [place] [phone]  [facility] Adult Primary Care, Dental, Pharmacy, Mental Health [address] [place], [place] [address] Visit Website Arlina Benjamin, [place] [phone]  [facility] Adult Primary Care, Mental Health [address] [place], [place] [address] Visit Website Elizabeth, [place], Laurina Popper [phone]  [facility] Adult Primary Care, Dental, Pharmacy, Mental Health [address] Adrianne [place], [place] [address] Visit Website Bing Buff  [phone]  [facility], Pharmacy [address] [place], [place] [address] Visit Website Artice Last, Arlice Bene [phone]  [facility] Adult Primary Care, Pharmacy, Mental Health [address] [place], [place] [address] Visit Website Elba Greathouse [phone]  [facility] Adult Primary Care, Dental, Pharmacy [address] [place], [place] [address] Visit Website Clair Crews [phone]  [facility] Adult Primary Care, Pharmacy, Mental Health [address] [place], [place] [address] Visit Website Gay Katayama [phone]  [facility] Adult Primary Care, Pharmacy [address] [place], [place] [address] Visit Website [place] [phone]  [facility] Adult Primary Care, Pharmacy [address] [place], [place] [address] Visit Website Stevens Eland [phone]  [facility] Adult Primary Care, Dental [address] [place], [place] [address] Visit Website Starr Eddy [phone]  [facility] [address] [place], [place] [address] Visit Website Dozier Genre [phone]  [facility] Adult Primary Care [address] [place], [place] [address] Visit Website [place], [place], [place], [place], [place], [place] [phone]  [facility] [address] [facility] Adult Primary Care, Mental Health [address] [place], [place] [address] Visit Website Michaeleen Adler [phone]  [facility] Adult Primary Care, Pharmacy, Mental Health [address], [place] [address] Visit Website Flanagan [phone]  [facility] Adult Primary Care, Pharmacy, Mental Health [address] Case [address] [place], [place] [address] Visit Website Judie Noun, [place] [phone]  [facility] Adult Primary Care, Pharmacy, Mental Health [address] [place], [place] [address] Visit Website Idolina Maker, [address], [place], Fabio Holts [phone]  [facility] [employer] Adult Primary Care, Dental, Pharmacy, Mental Health [address] [place], [place] [address] Visit Website Franklin Ito, [place] [phone]  [facility] Adult Primary Care [address], [place] [address] Visit Website Kristin Peyer [phone]  [facility] Adult Primary Care, Pharmacy, Mental Health [address] [place], [place] [address] Visit Website Jann Melody [phone]  Healing with Jerone Moorman. Dental, Mental Health  [address] [place], [place]  [address] Visit Website [place] [phone]  [facility] [address] ([facility] Adan Adas. [facility]) Adult Primary Care, Pharmacy [address], [place] [address] Visit Website [place], [place], [place], [place], Marissa Sida [phone]  [facility] [address], [place] [address] Visit Website Mardene Shake [phone]  [facility] Adult Primary Care, Pharmacy, Mental Health [address] [place], [place] [address] Visit Website Michaeleen Adler [phone]  [facility] Adult Primary  Care, Pharmacy [address] [place] [address] Visit Website [place], [place] [phone]  [facility] Adult Primary Care, Pharmacy, Mental Health [address] [place], [place] [address] Visit Website Doree Games [phone]  [facility] Adult Primary Care, Pharmacy [address] [place], [place] [address] Visit Website Raejean Bullock [phone]  [facility] Adult Primary Care, Pharmacy, Mental Health [address] [place], [place] [address] Visit Website Awilda Bogus, [place] [phone]  [facility] [address] [place], [place] [address] Visit Website Fronie Jewett [phone]  Jeneal Mins [facility] Adult Primary Care, Pharmacy [address] [place], [place] [address] Visit Website [place] [phone]  [facility] Adult Primary Care, Mental Health [address] Amy Kans[address] [place] [address] Visit Website Jone Neither [phone]  [facility] Adult Primary Care, Dental, Pharmacy, Mental Health [address], [place] [address] Visit Website [place], [place] [phone]  [facility] Pharmacy [address] [place], [place] [address] Visit Website Rosaria Common, Carolynn Citrin,  [place] [phone]  [facility] Adult Primary Care, Dental, Pharmacy [address] Sharron [place], [place] [address] Visit Website Chase Copping [phone]  [facility] Adult Primary Care, Pharmacy, Mental Health [address] [place], [place] [address] Visit Website Alverda Joe [phone]  [facility] Adult Primary Care, Mental Health [address] [place], [place] [address] Visit Website [place] [phone]  [facility] [address] [place], [place] [address] Visit Website [place], [place] [address] counties [phone]  [facility] Adult Primary Care, Mental Health [address] [place], [place] [address] Visit Website [place] [phone]  [facility] [address] [place], [place] [address] Visit Website Katharina Palin, Kristeen Peto [phone]  [facility] Adult Primary Care, Pharmacy [address] [place], [place] [address] Visit Website [place], [place], [place], [place], [place] [phone]  [facility] [address] [place], [place] [address] Visit Website Henderson Lock, Lavella Poser, [place] [phone]  [facility] Pharm[facility] [address] [place], [place] [address] Visit Website Tracy [phone]  [facility] Adult Primary Care, Pharmacy [address] [place], [place] [address] Visit Website Abelina Hoes, [place], [place], [place] Iba [phone]  [facility] Adult Primary Care, Pharmacy [address], [place] [address] Visit Website Junior [phone]  [facility] Adult Primary Care, Pharmacy [address] [place] [place], [place] [address] Visit Website Reymundo Caulk [phone]  [facility] Adult Primary Care, Pharmacy, Mental Health [address] [place], [place] [address] Visit Website Nicki Barnacle [phone]  [employer] [facility] Adult Primary Care, Pharmacy, Mental Health [address] [place], [place], [place] [address] Visit Website [facility] [phone]  Adult Primary Care, Pharmacy, Mental Health [address] [place], [place] [address] Visit Website [place], [place], [place], [place], [place], [place], [place], [place] [phone]  [facility] Adult Primary Care 1 Medical [address] [place], [place], [place] [address] Visit Website Ruben Corolla, Dallie Duel, [address], [place], [place], [place], Wilsey, Marion Sicilian [phone]  [facility] [address] [place], [place] [address] Visit Website [facility] [phone]  [facility] Adult Primary Care, Pharmacy [address] [place], [place] [address] Visit Website Ladera [phone]  ED Prescriptions     Medication Sig Dispense Auth. Provider   amoxicillin-clavulanate (AUGMENTIN) 875-125 MG tablet Take 1 tablet by mouth every 12 (twelve) hours for 10 days. 20 tablet Lesle Ras, MD      PDMP not reviewed this encounter.   Lesle Ras, MD 09/03/23 [phone]

## 2023-09-03 NOTE — ED Triage Notes (Signed)
 Pt reports has hole in tooth on right side and having swelling to right side of face since last week. Took tylenol  when woke up today.

## 2023-12-27 ENCOUNTER — Other Ambulatory Visit: Payer: Self-pay

## 2023-12-27 ENCOUNTER — Encounter (HOSPITAL_COMMUNITY): Payer: Self-pay | Admitting: Emergency Medicine

## 2023-12-27 ENCOUNTER — Ambulatory Visit (HOSPITAL_COMMUNITY)
Admission: EM | Admit: 2023-12-27 | Discharge: 2023-12-27 | Disposition: A | Discharge status: Home / Self Care | Payer: Self-pay | Attending: Internal Medicine | Admitting: Internal Medicine

## 2023-12-27 DIAGNOSIS — Z113 Encounter for screening for infections with a predominantly sexual mode of transmission: Secondary | ICD-10-CM | POA: Insufficient documentation

## 2023-12-27 DIAGNOSIS — K130 Diseases of lips: Secondary | ICD-10-CM | POA: Insufficient documentation

## 2023-12-27 NOTE — ED Provider Notes (Addendum)
 [facility]    CSN: [id number] Arrival date & time: 12/27/23  1325      History   Chief Complaint Chief Complaint  Patient presents with   SEXUALLY TRANSMITTED DISEASE    HPI Briana Lara is a 28 y.o. female.   Briana Lara is a 28 y.o. female presenting for chief complaint of screening for STD and upper lip lesion. A small red clear fluid-filled bump showed up to her right upper lip 1 week ago.  Lesion is tender.  She has been able to squeeze the lesion and expel clear material.  Lesion has now crusted back over and is improving in pain and size without intervention.  Denies history of HSV 1-HSV-2.  She is sexually active with female partner and performs oral sex on multiple partners unprotected.  She also states she has been kissing multiple people.  She is unsure of known exposures. Denies recent viral URI symptoms, fever, chills, and vaginal symptoms. She would like to be tested for STDs today.  Denies vaginal discharge, vaginal odor, vaginal itching, and chance of pregnancy.  Last menstrual cycle was December 14, 2023.     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 Authorizing Provider  amLODipine  (NORVASC ) 10 MG tablet Take 1 tablet (10 mg total) by mouth daily. 01/13/22 04/13/22  Raspet, Rocky POUR, PA-C    Family History History reviewed. No pertinent family history.  Social History Social History   Tobacco Use   Smoking status: Former    Types: Cigarettes   Smokeless tobacco: Never  Vaping Use   Vaping status: Some Days  Substance Use Topics   Alcohol use: Yes   Drug use: Yes    Types: Marijuana     Allergies   Mango flavoring agent (non-screening)   Review of Systems Review of Systems Per HPI  Physical Exam Triage Vital Signs ED Triage Vitals  Encounter Vitals Group     BP 12/27/23 1348 (!) 156/82     Girls Systolic BP Percentile --      Girls Diastolic BP Percentile --      Boys Systolic BP Percentile --      Boys  Diastolic BP Percentile --      Pulse Rate 12/27/23 1348 63     Resp 12/27/23 1348 (!) 22     Temp 12/27/23 1348 98.2 F (36.8 C)     Temp Source 12/27/23 1348 Oral     SpO2 12/27/23 1348 98 %     Weight --      Height --      Head Circumference --      Peak Flow --      Pain Score 12/27/23 1345 0     Pain Loc --      Pain Education --      Exclude from Growth Chart --    No data found.  Updated Vital Signs BP (!) 156/82 (BP Location: Right Arm)   Pulse 63   Temp 98.2 F (36.8 C) (Oral)   Resp (!) 22   LMP 12/14/2023 (Approximate)   SpO2 98%   Visual Acuity Right Eye Distance:   Left Eye Distance:   Bilateral Distance:    Right Eye Near:   Left Eye Near:    Bilateral Near:     Physical Exam Vitals and nursing note reviewed.  Constitutional:      Appearance: She is not ill-appearing or toxic-appearing.  HENT:     Head: Normocephalic and atraumatic.      Right Ear: Hearing and external ear normal.     Left Ear: Hearing and external ear normal.     Nose: Nose normal.     Mouth/Throat:     Lips: Pink. Lesions (Pinpoint subtly erythematous vesicular lesion to the top right lip as seen in image below near the vermilion border.) present.     Comments: No further oral lesions to the mouth other than 1 lip lesion to the right upper lip. Eyes:     General: Lids are normal. Vision grossly intact. Gaze aligned appropriately.     Extraocular Movements: Extraocular movements intact.     Conjunctiva/sclera: Conjunctivae normal.  Pulmonary:     Effort: Pulmonary effort is normal.  Genitourinary:    Comments: Declined.  Musculoskeletal:     Cervical back: Neck supple.  Skin:    General: Skin is warm and dry.     Capillary Refill: Capillary refill takes less than 2 seconds.     Findings: No rash.  Neurological:     General: No focal deficit present.     Mental Status: She is alert and oriented to person, place, and time. Mental status is at baseline.     Cranial Nerves: No dysarthria or facial asymmetry.  Psychiatric:        Mood and Affect: Mood normal.        Speech: Speech normal.        Behavior: Behavior normal.        Thought Content: Thought content normal.        Judgment: Judgment normal.    Upper lip lesion   UC Treatments / Results  Labs (all labs ordered are listed, but only abnormal results are displayed) Labs Reviewed  HIV ANTIBODY (ROUTINE TESTING W REFLEX)  RPR  CERVICOVAGINAL ANCILLARY ONLY    EKG   Radiology No results found.  Procedures Procedures (including critical care time)  Medications Ordered in UC Medications - No data to display  Initial Impression / Assessment and Plan / UC Course  I have reviewed the triage vital signs and the nursing notes.  Pertinent labs & imaging results that were available during my  care of the patient were reviewed by me and considered in my medical decision making  (see chart for details).   1.  Lesion of lip Lesion of lip is likely an aphthous ulcer. Nondraining currently, crusted over, therefore we will not swab this lesion. Valtrex will not help very much at this time as lesion erupted 7 days ago. Advised to return to clinic if she sustains another lesion to the oral mucosa for HSV testing in the future.  Advised to avoid sexual intercourse orally until lesion has fully resolved.  2.  Screening for STD STI labs pending, will notify patient of positive results and treat accordingly per protocol when labs result.  Patient would like HIV and syphilis testing today.   Patient to avoid sexual intercourse until screening testing comes back.   Education provided regarding safe sexual practices and patient encouraged to use protection to prevent spread of STIs.    At discharge, patient actually declines HIV and syphilis blood work. I am okay with this. We will do this at her next visit for screening for STDs.  Counseled patient on potential for adverse effects with medications prescribed/recommended today, strict ER and return-to-clinic precautions discussed, patient verbalized understanding.    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 We have not swabbed the lesion today due to no drainage, the lesion has crusted up.  This means that the lesion is healing on its own.  Do not continue to pop the lesion, do not touch the lesion. Do not perform oral sex until the lesion has fully resolved.  If you develop another sore to the mouth that starts to drain or becomes very painful, come to urgent care in the first couple of days and we will be able to swab the sore and test for herpes simplex virus 1 (canker sore) and herpes simplex virus 2 (genital herpes).  STD testing pending, this  will take 2-3 days to result. We will only call you if your testing is positive for any infection(s) and we will provide treatment.  Avoid sexual intercourse until your STD results come back.  If any of your STD results are positive, you will need to avoid sexual intercourse for 7 days while you are being treated to prevent spread of STD.  Condom use is the best way to prevent spread of STDs. Notify partner(s) of any positive results.  Return to urgent care as needed.      ED Prescriptions   None    PDMP not reviewed this encounter.   Enedelia Dorna HERO, FNP 12/27/23 1432    Enedelia Dorna HERO, FNP 12/27/23 1447

## 2023-12-27 NOTE — ED Triage Notes (Signed)
 Reports concern for bumps on upper lip.    Patient has had a vaginal discharge.

## 2023-12-27 NOTE — Discharge Instructions (Signed)
 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 We have not swabbed the lesion today due to no drainage, the lesion has crusted up.  This means that the lesion is healing on its own.  Do not continue to pop the lesion, do not touch the lesion. Do not perform oral sex until the lesion has fully resolved.  If you develop another sore to the mouth that starts to drain or becomes very painful, come to urgent care in the first couple of days and we will be able to swab the sore and test for herpes simplex virus 1 (canker sore) and herpes simplex virus 2 (genital herpes).  STD testing pending, this will take 2-3 days to result. We will only call you if your testing is positive for any infection(s) and we will provide treatment.  Avoid sexual intercourse until your STD results come back.  If any of your STD results are positive, you will need to avoid sexual intercourse for 7 days while you are being treated to prevent spread of STD.  Condom use is the best way to prevent spread of STDs. Notify partner(s) of any positive results.  Return to urgent care as needed.

## 2023-12-28 LAB — CERVICOVAGINAL ANCILLARY ONLY
Chlamydia: NEGATIVE
Comment: NEGATIVE
Comment: NEGATIVE
Comment: NORMAL
Neisseria Gonorrhea: NEGATIVE
Trichomonas: NEGATIVE
